# Patient Record
Sex: Female | Born: 1995 | Race: Black or African American | Hispanic: No | Marital: Married | Smoking: Never smoker
Health system: Southern US, Community
[De-identification: ages and names within clinical notes are randomized; demographics above are authoritative.]

## PROBLEM LIST (undated history)

## (undated) ENCOUNTER — Inpatient Hospital Stay (HOSPITAL_COMMUNITY): Payer: Self-pay

## (undated) DIAGNOSIS — Z789 Other specified health status: Secondary | ICD-10-CM

## (undated) HISTORY — PX: NO PAST SURGERIES: SHX2092

---

## 2018-09-25 ENCOUNTER — Inpatient Hospital Stay (HOSPITAL_COMMUNITY): Payer: Self-pay

## 2018-09-25 ENCOUNTER — Ambulatory Visit (HOSPITAL_COMMUNITY)
Admission: EM | Admit: 2018-09-25 | Discharge: 2018-09-25 | Disposition: A | Discharge status: Nursing Home (Includes ICF) | Payer: Self-pay | Attending: Family Medicine | Admitting: Family Medicine

## 2018-09-25 ENCOUNTER — Encounter (HOSPITAL_COMMUNITY): Payer: Self-pay

## 2018-09-25 ENCOUNTER — Inpatient Hospital Stay (HOSPITAL_COMMUNITY)
Admission: AD | Admit: 2018-09-25 | Discharge: 2018-09-26 | Disposition: A | Discharge status: Home / Self Care | Payer: Self-pay | Source: Ambulatory Visit | Attending: Obstetrics & Gynecology | Admitting: Obstetrics & Gynecology

## 2018-09-25 ENCOUNTER — Ambulatory Visit
Admission: EM | Admit: 2018-09-25 | Discharge: 2018-09-25 | Disposition: A | Discharge status: Home / Self Care | Payer: Self-pay | Attending: Physician Assistant | Admitting: Physician Assistant

## 2018-09-25 ENCOUNTER — Other Ambulatory Visit: Payer: Self-pay

## 2018-09-25 DIAGNOSIS — Z3201 Encounter for pregnancy test, result positive: Secondary | ICD-10-CM

## 2018-09-25 DIAGNOSIS — O26891 Other specified pregnancy related conditions, first trimester: Secondary | ICD-10-CM | POA: Insufficient documentation

## 2018-09-25 DIAGNOSIS — R102 Pelvic and perineal pain: Secondary | ICD-10-CM | POA: Insufficient documentation

## 2018-09-25 DIAGNOSIS — O26899 Other specified pregnancy related conditions, unspecified trimester: Secondary | ICD-10-CM

## 2018-09-25 DIAGNOSIS — Z3A01 Less than 8 weeks gestation of pregnancy: Secondary | ICD-10-CM | POA: Insufficient documentation

## 2018-09-25 DIAGNOSIS — O3680X0 Pregnancy with inconclusive fetal viability, not applicable or unspecified: Secondary | ICD-10-CM

## 2018-09-25 DIAGNOSIS — Z3401 Encounter for supervision of normal first pregnancy, first trimester: Secondary | ICD-10-CM

## 2018-09-25 DIAGNOSIS — R1030 Lower abdominal pain, unspecified: Secondary | ICD-10-CM

## 2018-09-25 LAB — WET PREP, GENITAL
Clue Cells Wet Prep HPF POC: NONE SEEN
Sperm: NONE SEEN
Trich, Wet Prep: NONE SEEN
Yeast Wet Prep HPF POC: NONE SEEN

## 2018-09-25 LAB — POCT URINALYSIS DIP (DEVICE)
Bilirubin Urine: NEGATIVE
Glucose, UA: NEGATIVE mg/dL
Hgb urine dipstick: NEGATIVE
Ketones, ur: NEGATIVE mg/dL
Leukocytes, UA: NEGATIVE
NITRITE: NEGATIVE
Protein, ur: NEGATIVE mg/dL
Specific Gravity, Urine: 1.02 (ref 1.005–1.030)
Urobilinogen, UA: 0.2 mg/dL (ref 0.0–1.0)
pH: 6 (ref 5.0–8.0)

## 2018-09-25 LAB — CBC
HCT: 40.9 % (ref 36.0–46.0)
Hemoglobin: 14.2 g/dL (ref 12.0–15.0)
MCH: 29.2 pg (ref 26.0–34.0)
MCHC: 34.7 g/dL (ref 30.0–36.0)
MCV: 84 fL (ref 80.0–100.0)
Platelets: 301 10*3/uL (ref 150–400)
RBC: 4.87 MIL/uL (ref 3.87–5.11)
RDW: 12.9 % (ref 11.5–15.5)
WBC: 8.2 10*3/uL (ref 4.0–10.5)
nRBC: 0 % (ref 0.0–0.2)

## 2018-09-25 LAB — HCG, QUANTITATIVE, PREGNANCY: hCG, Beta Chain, Quant, S: 2710 m[IU]/mL — ABNORMAL HIGH (ref <5)

## 2018-09-25 LAB — POCT URINE PREGNANCY: Preg Test, Ur: POSITIVE — AB

## 2018-09-25 LAB — POCT PREGNANCY, URINE: Preg Test, Ur: POSITIVE — AB

## 2018-09-25 MED ORDER — PRENATAL VITAMINS 0.8 MG PO TABS: 1.0000 | ORAL_TABLET | Freq: Every day | ORAL | 2 refills | Status: AC

## 2018-09-25 MED ORDER — ACETAMINOPHEN 325 MG PO TABS: 650.0000 mg | ORAL_TABLET | Freq: Four times a day (QID) | ORAL | 0 refills | Status: AC | PRN

## 2018-09-25 NOTE — MAU Provider Note (Signed)
Chief Complaint: Abdominal Pain   Provider saw patient at 2329     SUBJECTIVE HPI: Jenna Miranda is a 23 y.o. G1P0 at 7538w1d by LMP who presents to maternity admissions reporting lower abdominal pain since this afternoon.  States it is over her bladder.  . She denies vaginal bleeding, vaginal itching/burning, h/a, dizziness, n/v, or fever/chills.    Abdominal Pain  This is a new problem. The current episode started today. The problem occurs intermittently. The problem has been unchanged. The quality of the pain is cramping. The abdominal pain does not radiate. Pertinent negatives include no constipation, diarrhea, dysuria, myalgias, nausea or vomiting. Nothing aggravates the pain. The pain is relieved by nothing. She has tried nothing for the symptoms.   RN Note: Pt husband translating on phone. Pt husband reports that he translated for the pt at Urgent Care. Pt here for abdominal pain that started today around 2pm. Pt describes the pain as bladder pain and its moving under her stomach. Pt denies urinary burning or pain. Denies vaginal bleeding or vaginal discharge. Rates pain 8/10. Pt has not tried anything for pain. LMP 08/21/2018.  No past medical history on file. No past surgical history on file. Social History   Socioeconomic History  . Marital status: Married    Spouse name: Not on file  . Number of children: Not on file  . Years of education: Not on file  . Highest education level: Not on file  Occupational History  . Not on file  Social Needs  . Financial resource strain: Not on file  . Food insecurity:    Worry: Not on file    Inability: Not on file  . Transportation needs:    Medical: Not on file    Non-medical: Not on file  Tobacco Use  . Smoking status: Never Smoker  . Smokeless tobacco: Never Used  Substance and Sexual Activity  . Alcohol use: Not Currently  . Drug use: Not Currently  . Sexual activity: Not on file  Lifestyle  . Physical activity:    Days  per week: Not on file    Minutes per session: Not on file  . Stress: Not on file  Relationships  . Social connections:    Talks on phone: Not on file    Gets together: Not on file    Attends religious service: Not on file    Active member of club or organization: Not on file    Attends meetings of clubs or organizations: Not on file    Relationship status: Not on file  . Intimate partner violence:    Fear of current or ex partner: Not on file    Emotionally abused: Not on file    Physically abused: Not on file    Forced sexual activity: Not on file  Other Topics Concern  . Not on file  Social History Narrative  . Not on file   No current facility-administered medications on file prior to encounter.    Current Outpatient Medications on File Prior to Encounter  Medication Sig Dispense Refill  . Prenatal Multivit-Min-Fe-FA (PRENATAL VITAMINS) 0.8 MG tablet Take 1 tablet by mouth daily. 90 tablet 2   No Known Allergies  I have reviewed patient's Past Medical Hx, Surgical Hx, Family Hx, Social Hx, medications and allergies.   ROS:  Review of Systems  Gastrointestinal: Positive for abdominal pain. Negative for constipation, diarrhea, nausea and vomiting.  Genitourinary: Negative for dysuria.  Musculoskeletal: Negative for myalgias.   Review  of Systems  Other systems negative   Physical Exam  Physical Exam Patient Vitals for the past 24 hrs:  BP Temp Temp src Pulse Resp SpO2 Weight  09/25/18 2315 121/70 98.4 F (36.9 C) Oral 74 16 98 % 64.4 kg   Constitutional: Well-developed, well-nourished female in no acute distress.  Cardiovascular: normal rate Respiratory: normal effort GI: Abd soft, non-tender. Pos BS x 4 MS: Extremities nontender, no edema, normal ROM Neurologic: Alert and oriented x 4.  GU: Neg CVAT.  PELVIC EXAM: Cervix pink, visually closed, without lesion, scant white creamy discharge, vaginal walls and external genitalia normal Bimanual exam: Cervix  0/long/high, firm, anterior, neg CMT, uterus slightly tender, nonenlarged, adnexa without tenderness, enlargement, or mass  No focal adnexal tenderness  LAB RESULTS Results for orders placed or performed during the hospital encounter of 09/25/18 (from the past 24 hour(s))  CBC     Status: None   Collection Time: 09/25/18  8:49 PM  Result Value Ref Range   WBC 8.2 4.0 - 10.5 K/uL   RBC 4.87 3.87 - 5.11 MIL/uL   Hemoglobin 14.2 12.0 - 15.0 g/dL   HCT 40.940.9 81.136.0 - 91.446.0 %   MCV 84.0 80.0 - 100.0 fL   MCH 29.2 26.0 - 34.0 pg   MCHC 34.7 30.0 - 36.0 g/dL   RDW 78.212.9 95.611.5 - 21.315.5 %   Platelets 301 150 - 400 K/uL   nRBC 0.0 0.0 - 0.2 %  hCG, quantitative, pregnancy     Status: Abnormal   Collection Time: 09/25/18  8:49 PM  Result Value Ref Range   hCG, Beta Chain, Quant, S 2,710 (H) <5 mIU/mL  ABO/Rh     Status: None (Preliminary result)   Collection Time: 09/25/18  8:49 PM  Result Value Ref Range   ABO/RH(D)      A POS Performed at [facility], [address] [place], [place] [address]     --/--/A POS Performed at [facility], [address] [place], [place] [address]  [phone](01/16 2049)  IMAGING Kore[place] Ob Comp Less 14 Wks  Result Date: 09/25/2018 CLINICAL DATA:  23 year old female with lower abdominal pain today. Quantitative beta HCG 2,710. Estimated gestational age by LMP 4 weeks and 6 days. EXAM: OBSTETRIC <14 WK US AND TRANSVAGINAL OB US TECHNIQUE: Both transabdominal and transvaginal ultrasound examinations were performed for complete evaluation of the gestation as well as the maternal uterus, adnexal regions, and pelvic cul-de-sac. Transvaginal technique was performed to assess early pregnancy. COMPARISON:  None. FINDINGS: Intrauterine gestational sac: Possible single (image 2839). Yolk sac:  Not identified. Embryo:  Not identified. Cardiac Activity: Not applicable MSD: 4.3 mm   5 w   1 d Subchorionic hemorrhage:  None visualized. Maternal uterus/adnexae: Probable left ovary  corpus luteum cyst (image 45). The left ovary measures 1.3 x 1.6 x 2.1 centimeters. The right ovary measures 3.5 x 1.9 by 1.8 centimeters. There is a trace or small volume of simple appearing pelvic free fluid. IMPRESSION: Probable early intrauterine gestational sac, but no yolk sac, fetal pole, or cardiac activity yet visualized. Recommend follow-up quantitative B-HCG levels and follow-up US in 14 days to confirm and assess viability. This recommendation follows SRU consensus guidelines: Diagnostic Criteria for Nonviable Pregnancy Early in the First Trimester. Malva Limes Engl J Med 2013; 841:3244-01; 369:1443-51. Electronically Signed   By: Odessa FlemingH  Hall M.D.   On: 09/25/2018 23:06   [place] Ob Transvaginal  Result Date: 09/25/2018 CLINICAL DATA:  23 year old female with lower abdominal pain today. Quantitative  beta HCG 2,710. Estimated gestational age by LMP 4 weeks and 6 days. EXAM: OBSTETRIC <14 WK [place] AND TRANSVAGINAL OB US TECHNIQUE: Both transabdominal and transvaginal ultrasound examinations were performed for complete evaluation of the gestation as well as the maternal uterus, adnexal regions, and pelvic cul-de-sac. Transvaginal technique was performed to assess early pregnancy. COMPARISON:  None. FINDINGS: Intrauterine gestational sac: Possible single (image 48). Yolk sac:  Not identified. Embryo:  Not identified. Cardiac Activity: Not applicable MSD: 4.3 mm   5 w   1 d Subchorionic hemorrhage:  None visualized. Maternal uterus/adnexae: Probable left ovary corpus luteum cyst (image 45). The left ovary measures 1.3 x 1.6 x 2.1 centimeters. The right ovary measures 3.5 x 1.9 by 1.8 centimeters. There is a trace or small volume of simple appearing pelvic free fluid. IMPRESSION: Probable early intrauterine gestational sac, but no yolk sac, fetal pole, or cardiac activity yet visualized. Recommend follow-up quantitative B-HCG levels and follow-up US in 14 days to confirm and assess viability. This recommendation follows SRU consensus  guidelines: Diagnostic Criteria for Nonviable Pregnancy Early in the First Trimester. Malva Limes Med 2013; 275:1700-17. Electronically Signed   By: Odessa Fleming M.D.   On: 09/25/2018 23:06    MAU Management/MDM: Ordered usual first trimester r/o ectopic labs.   Pelvic exam and cultures done Will check baseline Ultrasound to rule out ectopic..   This bleeding/pain can represent a normal pregnancy with bleeding, spontaneous abortion or even an ectopic which can be life-threatening.  The process as listed above helps to determine which of these is present.  Explained that we cannot know for sure where pregnancy is and how well it is developing.  We need to repeat HCG on Sunday and [place] in a week.   ASSESSMENT 1. Pelvic pain affecting pregnancy   2. Pregnancy of unknown anatomic location   3.     Probable early intrauterine gestational sac  PLAN Discharge home Plan to repeat HCG level in 48 hours in MAU Will repeat  Ultrasound in about 7-10 days if HCG levels double appropriately  Ectopic precautions  Follow-up Information    [facility] Follow up.   Specialty:  Obstetrics and Gynecology Why:  Come back Sunday morning Contact information: [address] [id number] mc [place] [address] [phone]         Pt stable at time of discharge. Encouraged to return here or to other Urgent Care/ED if she develops worsening of symptoms, increase in pain, fever, or other concerning symptoms.    Wynelle Bourgeois CNM, MSN Certified Nurse-Midwife 09/25/2018  11:47 PM

## 2018-09-25 NOTE — MAU Note (Signed)
Pt husband translating on phone. Pt husband reports that he translated for the pt at Urgent Care. Pt here for abdominal pain that started today around 2pm. Pt describes the pain as bladder pain and its moving under her stomach. Pt denies urinary burning or pain. Denies vaginal bleeding or vaginal discharge. Rates pain 8/10. Pt has not tried anything for pain. LMP 08/21/2018.

## 2018-09-25 NOTE — ED Triage Notes (Signed)
Pt presents to [facility] for lower abdominal pain since today,possible pregnancy.

## 2018-09-25 NOTE — ED Triage Notes (Signed)
Pt requesting pregnancy test and birth control, LMP 08/22/18; no other complaints

## 2018-09-25 NOTE — Discharge Instructions (Signed)
Abdominal Pain During Pregnancy ° °Abdominal pain is common during pregnancy, and has many possible causes. Some causes are more serious than others, and sometimes the cause is not known. Abdominal pain can be a sign that labor is starting. It can also be caused by normal growth and stretching of muscles and ligaments during pregnancy. Always tell your health care provider if you have any abdominal pain. °Follow these instructions at home: °· Do not have sex or put anything in your vagina until your pain goes away completely. °· Get plenty of rest until your pain improves. °· Drink enough fluid to keep your urine pale yellow. °· Take over-the-counter and prescription medicines only as told by your health care provider. °· Keep all follow-up visits as told by your health care provider. This is important. °Contact a health care provider if: °· Your pain continues or gets worse after resting. °· You have lower abdominal pain that: °? Comes and goes at regular intervals. °? Spreads to your back. °? Is similar to menstrual cramps. °· You have pain or burning when you urinate. °Get help right away if: °· You have a fever or chills. °· You have vaginal bleeding. °· You are leaking fluid from your vagina. °· You are passing tissue from your vagina. °· You have vomiting or diarrhea that lasts for more than 24 hours. °· Your baby is moving less than usual. °· You feel very weak or faint. °· You have shortness of breath. °· You develop severe pain in your upper abdomen. °Summary °· Abdominal pain is common during pregnancy, and has many possible causes. °· If you experience abdominal pain during pregnancy, tell your health care provider right away. °· Follow your health care provider's home care instructions and keep all follow-up visits as directed. °This information is not intended to replace advice given to you by your health care provider. Make sure you discuss any questions you have with your health care  provider. °Document Released: 08/27/2005 Document Revised: 11/29/2016 Document Reviewed: 11/29/2016 °Elsevier Interactive Patient Education © 2019 Elsevier Inc. ° °

## 2018-09-25 NOTE — MAU Note (Addendum)
Pt reports abdominal pain but no bleeding. Unable to triage patient further. Patient is alone and speaks xhosa. Per [employer], there is no xhosa interpretor. Pt does not have family with her.

## 2018-09-25 NOTE — ED Provider Notes (Signed)
[facility]    CSN: [id number] Arrival date & time: 09/25/18  1039     History   Chief Complaint Chief Complaint  Patient presents with  . preg test and birth controll    HPI Jenna Miranda is a 23 y.o. female.   The history is provided by the patient. No language interpreter was used.  Possible Pregnancy  This is a new problem. The current episode started more than 1 week ago. The problem occurs constantly. The problem has not changed since onset.Pertinent negatives include no abdominal pain. Nothing aggravates the symptoms. Nothing relieves the symptoms. She has tried nothing for the symptoms.  Pt reports her period is late.  Pt request a pregnany test   History reviewed. No pertinent past medical history.  There are no active problems to display for this patient.   History reviewed. No pertinent surgical history.  OB History   No obstetric history on file.      Home Medications    Prior to Admission medications   Not on File    Family History No family history on file.  Social History Social History   Tobacco Use  . Smoking status: Never Smoker  . Smokeless tobacco: Never Used  Substance Use Topics  . Alcohol use: Not Currently  . Drug use: Not Currently     Allergies   Patient has no known allergies.   Review of Systems Review of Systems  Gastrointestinal: Negative for abdominal pain.  All other systems reviewed and are negative.    Physical Exam Triage Vital Signs ED Triage Vitals  Enc Vitals Group     BP 09/25/18 1043 108/75     Pulse --      Resp 09/25/18 1043 16     Temp 09/25/18 1043 98.1 F (36.7 C)     Temp Source 09/25/18 1043 Oral     SpO2 09/25/18 1043 98 %     Weight --      Height --      Head Circumference --      Peak Flow --      Pain Score 09/25/18 1044 0     Pain Loc --      Pain Edu? --      Excl. in GC? --    No data found.  Updated Vital Signs BP 108/75 (BP Location: Left Arm)    Temp 98.1 F (36.7 C) (Oral)   Resp 16   LMP 08/22/2018   SpO2 98%   Visual Acuity Right Eye Distance:   Left Eye Distance:   Bilateral Distance:    Right Eye Near:   Left Eye Near:    Bilateral Near:     Physical Exam Vitals signs and nursing note reviewed.  Constitutional:      Appearance: She is well-developed.  HENT:     Head: Normocephalic.  Neck:     Musculoskeletal: Normal range of motion.  Pulmonary:     Effort: Pulmonary effort is normal.  Abdominal:     General: There is no distension.  Musculoskeletal: Normal range of motion.  Neurological:     Mental Status: She is alert and oriented to person, place, and time.      UC Treatments / Results  Labs (all labs ordered are listed, but only abnormal results are displayed) Labs Reviewed  POCT URINE PREGNANCY - Abnormal; Notable for the following components:      Result Value   Preg Test, Ur Positive (*)  All other components within normal limits    EKG None  Radiology No results found.  Procedures Procedures (including critical care time)  Medications Ordered in UC Medications - No data to display  Initial Impression / Assessment and Plan / UC Course  I have reviewed the triage vital signs and the nursing notes.  Pertinent labs & imaging results that were available during my care of the patient were reviewed by me and considered in my medical decision making (see chart for details).     UPT is positive  Final Clinical Impressions(s) / UC Diagnoses   Final diagnoses:  Encounter for supervision of normal first pregnancy in first trimester   Discharge Instructions   None    ED Prescriptions    Medication Sig Dispense Auth. Provider   Prenatal Multivit-Min-Fe-FA (PRENATAL VITAMINS) 0.8 MG tablet Take 1 tablet by mouth daily. 90 tablet Elson Areas, [place]    An After Visit Summary was printed and given to the patient.  Controlled Substance Prescriptions [place] Controlled Substance  Registry consulted?    Elson Areas, [place] 09/25/18 1112

## 2018-09-26 DIAGNOSIS — O3680X0 Pregnancy with inconclusive fetal viability, not applicable or unspecified: Secondary | ICD-10-CM

## 2018-09-26 LAB — HIV ANTIBODY (ROUTINE TESTING W REFLEX): HIV Screen 4th Generation wRfx: NONREACTIVE

## 2018-09-26 LAB — ABO/RH: ABO/RH(D): A POS

## 2018-09-29 LAB — GC/CHLAMYDIA PROBE AMP (~~LOC~~) NOT AT ARMC
CHLAMYDIA, DNA PROBE: NEGATIVE
Neisseria Gonorrhea: NEGATIVE

## 2018-10-04 ENCOUNTER — Inpatient Hospital Stay (HOSPITAL_COMMUNITY): Payer: Self-pay

## 2018-10-04 ENCOUNTER — Encounter (HOSPITAL_COMMUNITY): Payer: Self-pay | Admitting: *Deleted

## 2018-10-04 ENCOUNTER — Inpatient Hospital Stay (HOSPITAL_COMMUNITY)
Admission: AD | Admit: 2018-10-04 | Discharge: 2018-10-04 | Disposition: A | Discharge status: Home / Self Care | Payer: Self-pay | Attending: Obstetrics & Gynecology | Admitting: Obstetrics & Gynecology

## 2018-10-04 DIAGNOSIS — O468X1 Other antepartum hemorrhage, first trimester: Secondary | ICD-10-CM

## 2018-10-04 DIAGNOSIS — O99611 Diseases of the digestive system complicating pregnancy, first trimester: Secondary | ICD-10-CM | POA: Insufficient documentation

## 2018-10-04 DIAGNOSIS — O26891 Other specified pregnancy related conditions, first trimester: Secondary | ICD-10-CM

## 2018-10-04 DIAGNOSIS — O418X10 Other specified disorders of amniotic fluid and membranes, first trimester, not applicable or unspecified: Secondary | ICD-10-CM

## 2018-10-04 DIAGNOSIS — K219 Gastro-esophageal reflux disease without esophagitis: Secondary | ICD-10-CM | POA: Insufficient documentation

## 2018-10-04 DIAGNOSIS — R109 Unspecified abdominal pain: Secondary | ICD-10-CM

## 2018-10-04 DIAGNOSIS — Z3A01 Less than 8 weeks gestation of pregnancy: Secondary | ICD-10-CM | POA: Insufficient documentation

## 2018-10-04 DIAGNOSIS — O26899 Other specified pregnancy related conditions, unspecified trimester: Secondary | ICD-10-CM

## 2018-10-04 DIAGNOSIS — O208 Other hemorrhage in early pregnancy: Secondary | ICD-10-CM | POA: Insufficient documentation

## 2018-10-04 LAB — CBC WITH DIFFERENTIAL/PLATELET
Basophils Absolute: 0 10*3/uL (ref 0.0–0.1)
Basophils Relative: 0 %
Eosinophils Absolute: 0.1 10*3/uL (ref 0.0–0.5)
Eosinophils Relative: 2 %
HEMATOCRIT: 39.6 % (ref 36.0–46.0)
Hemoglobin: 13.8 g/dL (ref 12.0–15.0)
Lymphocytes Relative: 55 %
Lymphs Abs: 3.9 10*3/uL (ref 0.7–4.0)
MCH: 29.9 pg (ref 26.0–34.0)
MCHC: 34.8 g/dL (ref 30.0–36.0)
MCV: 85.7 fL (ref 80.0–100.0)
Monocytes Absolute: 0.4 10*3/uL (ref 0.1–1.0)
Monocytes Relative: 6 %
NEUTROS PCT: 37 %
NRBC: 0 % (ref 0.0–0.2)
Neutro Abs: 2.7 10*3/uL (ref 1.7–7.7)
Platelets: 261 10*3/uL (ref 150–400)
RBC: 4.62 MIL/uL (ref 3.87–5.11)
RDW: 13.1 % (ref 11.5–15.5)
WBC: 7.1 10*3/uL (ref 4.0–10.5)

## 2018-10-04 LAB — URINALYSIS, ROUTINE W REFLEX MICROSCOPIC
Bilirubin Urine: NEGATIVE
Glucose, UA: NEGATIVE mg/dL
Hgb urine dipstick: NEGATIVE
KETONES UR: NEGATIVE mg/dL
Leukocytes, UA: NEGATIVE
NITRITE: NEGATIVE
PH: 5 (ref 5.0–8.0)
Protein, ur: NEGATIVE mg/dL
Specific Gravity, Urine: 1.013 (ref 1.005–1.030)

## 2018-10-04 MED ORDER — LIDOCAINE VISCOUS HCL 2 % MT SOLN
15.0000 mL | Freq: Once | OROMUCOSAL | Status: AC
  Administered 2018-10-04: 15 mL via ORAL
  Filled 2018-10-04: qty 15

## 2018-10-04 MED ORDER — FAMOTIDINE 20 MG PO TABS: 20.0000 mg | ORAL_TABLET | Freq: Two times a day (BID) | ORAL | 1 refills | Status: AC

## 2018-10-04 MED ORDER — ALUM & MAG HYDROXIDE-SIMETH 200-200-20 MG/5ML PO SUSP
30.0000 mL | Freq: Once | ORAL | Status: AC
  Administered 2018-10-04: 30 mL via ORAL
  Filled 2018-10-04: qty 30

## 2018-10-04 NOTE — MAU Provider Note (Signed)
History     CSN: [id number]  Arrival date and time: 10/04/18 1658   First Provider Initiated Contact with Patient 10/04/18 1800      Chief Complaint  Patient presents with  . Abdominal Pain  . Chest Pain   HPI [name] is a 23 y.o. G2P1001 at [redacted]w[redacted]d who presents to MAU today with complaint of lower abdominal pain x 5 days rated at 8/10 now. She has been taking Tylenol. She denies vaginal bleeding. She was seen in MAU on 1/16 and US showed PUL. She was supposed to follow-up in 2 days and did not. She denies bleeding. She has also had midline chest pain without SOB.    OB History    Gravida  2   Para  1   Term  1   Preterm      AB      Living  1     SAB      TAB      Ectopic      Multiple      Live Births  1           History reviewed. No pertinent past medical history.  History reviewed. No pertinent surgical history.  History reviewed. No pertinent family history.  Social History   Tobacco Use  . Smoking status: Never Smoker  . Smokeless tobacco: Never Used  Substance Use Topics  . Alcohol use: Not Currently  . Drug use: Not Currently    Allergies: No Known Allergies  No medications prior to admission.    Review of Systems  Respiratory: Negative for shortness of breath.   Cardiovascular: Positive for chest pain.  Gastrointestinal: Positive for abdominal pain.  Genitourinary: Negative for vaginal bleeding and vaginal discharge.   Physical Exam   Blood pressure 120/61, pulse 72, temperature 98.5 F (36.9 C), temperature source Oral, resp. rate 16, weight 64.9 kg, last menstrual period 08/22/2018, SpO2 100 %.  Physical Exam  Nursing note and vitals reviewed. Constitutional: She is oriented to person, place, and time. She appears well-developed and well-nourished. No distress.  HENT:  Head: Normocephalic and atraumatic.  Cardiovascular: Normal rate.  Respiratory: Effort normal.  GI: Soft. She exhibits no distension and no  mass. There is abdominal tenderness (mild diffuse). There is no rebound and no guarding.  Neurological: She is alert and oriented to person, place, and time.  Skin: Skin is warm and dry. No erythema.  Psychiatric: She has a normal mood and affect.  Cervix: closed, thick   Results for orders placed or performed during the hospital encounter of 10/04/18 (from the past 24 hour(s))  Urinalysis, Routine w reflex microscopic     Status: None   Collection Time: 10/04/18  5:42 PM  Result Value Ref Range   Color, Urine YELLOW YELLOW   APPearance CLEAR CLEAR   Specific Gravity, Urine 1.013 1.005 - 1.030   pH 5.0 5.0 - 8.0   Glucose, UA NEGATIVE NEGATIVE mg/dL   Hgb urine dipstick NEGATIVE NEGATIVE   Bilirubin Urine NEGATIVE NEGATIVE   Ketones, ur NEGATIVE NEGATIVE mg/dL   Protein, ur NEGATIVE NEGATIVE mg/dL   Nitrite NEGATIVE NEGATIVE   Leukocytes, UA NEGATIVE NEGATIVE  CBC with Differential/Platelet     Status: None   Collection Time: 10/04/18  5:53 PM  Result Value Ref Range   WBC 7.1 4.0 - 10.5 K/uL   RBC 4.62 3.87 - 5.11 MIL/uL   Hemoglobin 13.8 12.0 - 15.0 g/dL   HCT 91.7 91.5 -  46.0 %   MCV 85.7 80.0 - 100.0 fL   MCH 29.9 26.0 - 34.0 pg   MCHC 34.8 30.0 - 36.0 g/dL   RDW 27.2 53.6 - 64.4 %   Platelets 261 150 - 400 K/uL   nRBC 0.0 0.0 - 0.2 %   Neutrophils Relative % 37 %   Neutro Abs 2.7 1.7 - 7.7 K/uL   Lymphocytes Relative 55 %   Lymphs Abs 3.9 0.7 - 4.0 K/uL   Monocytes Relative 6 %   Monocytes Absolute 0.4 0.1 - 1.0 K/uL   Eosinophils Relative 2 %   Eosinophils Absolute 0.1 0.0 - 0.5 K/uL   Basophils Relative 0 %   Basophils Absolute 0.0 0.0 - 0.1 K/uL   US Ob Comp Less 14 Wks  Result Date: 09/25/2018 CLINICAL DATA:  23 year old female with lower abdominal pain today. Quantitative beta HCG 2,710. Estimated gestational age by LMP 4 weeks and 6 days. EXAM: OBSTETRIC <14 WK [place] AND TRANSVAGINAL OB US TECHNIQUE: Both transabdominal and transvaginal ultrasound examinations  were performed for complete evaluation of the gestation as well as the maternal uterus, adnexal regions, and pelvic cul-de-sac. Transvaginal technique was performed to assess early pregnancy. COMPARISON:  None. FINDINGS: Intrauterine gestational sac: Possible single (image 28). Yolk sac:  Not identified. Embryo:  Not identified. Cardiac Activity: Not applicable MSD: 4.3 mm   5 w   1 d Subchorionic hemorrhage:  None visualized. Maternal uterus/adnexae: Probable left ovary corpus luteum cyst (image 45). The left ovary measures 1.3 x 1.6 x 2.1 centimeters. The right ovary measures 3.5 x 1.9 by 1.8 centimeters. There is a trace or small volume of simple appearing pelvic free fluid. IMPRESSION: Probable early intrauterine gestational sac, but no yolk sac, fetal pole, or cardiac activity yet visualized. Recommend follow-up quantitative B-HCG levels and follow-up US in 14 days to confirm and assess viability. This recommendation follows SRU consensus guidelines: Diagnostic Criteria for Nonviable Pregnancy Early in the First Trimester. Malva Limes Med 2013; 034:7425-95. Electronically Signed   By: Odessa Fleming M.D.   On: 09/25/2018 23:06   US Ob Transvaginal  Result Date: 10/04/2018 CLINICAL DATA:  Abdominal pain for 2 days. First trimester pregnancy with inconclusive fetal viability. EXAM: TRANSVAGINAL OB ULTRASOUND TECHNIQUE: Transvaginal ultrasound was performed for complete evaluation of the gestation as well as the maternal uterus, adnexal regions, and pelvic cul-de-sac. COMPARISON:  09/25/2018 FINDINGS: Intrauterine gestational sac: Single Yolk sac:  Visualized. Embryo:  Visualized. Cardiac Activity: Visualized. Heart Rate: 105 bpm CRL:   2 mm   5 w 5 d                  [place] EDC: 06/01/2019 Subchorionic hemorrhage:  Small subchorionic hemorrhage noted. Maternal uterus/adnexae: Both ovaries are normal in appearance. No mass or abnormal free fluid identified. IMPRESSION: Single living IUP measuring 5 weeks 5 days, with [place] EDC  of 06/01/2019. Small subchorionic hemorrhage noted. Electronically Signed   By: Myles Rosenthal M.D.   On: 10/04/2018 19:17   US Ob Transvaginal  Result Date: 09/25/2018 CLINICAL DATA:  23 year old female with lower abdominal pain today. Quantitative beta HCG 2,710. Estimated gestational age by LMP 4 weeks and 6 days. EXAM: OBSTETRIC <14 WK [place] AND TRANSVAGINAL OB US TECHNIQUE: Both transabdominal and transvaginal ultrasound examinations were performed for complete evaluation of the gestation as well as the maternal uterus, adnexal regions, and pelvic cul-de-sac. Transvaginal technique was performed to assess early pregnancy. COMPARISON:  None. FINDINGS: Intrauterine gestational sac: Possible  single (image 39). Yolk sac:  Not identified. Embryo:  Not identified. Cardiac Activity: Not applicable MSD: 4.3 mm   5 w   1 d Subchorionic hemorrhage:  None visualized. Maternal uterus/adnexae: Probable left ovary corpus luteum cyst (image 45). The left ovary measures 1.3 x 1.6 x 2.1 centimeters. The right ovary measures 3.5 x 1.9 by 1.8 centimeters. There is a trace or small volume of simple appearing pelvic free fluid. IMPRESSION: Probable early intrauterine gestational sac, but no yolk sac, fetal pole, or cardiac activity yet visualized. Recommend follow-up quantitative B-HCG levels and follow-up US in 14 days to confirm and assess viability. This recommendation follows SRU consensus guidelines: Diagnostic Criteria for Nonviable Pregnancy Early in the First Trimester. Malva Limes Engl J Med 2013; 161:0960-45; 369:1443-51. Electronically Signed   By: Odessa FlemingH  Hall M.D.   On: 09/25/2018 23:06     MAU Course  Procedures None  MDM UA, CBC, EKG and US today  EKG - NSR GI cocktail given - patient reports pain is 4/10 at discharge Assessment and Plan  A: SIUP at 6963w5d Small subchorionic hemorrhage Abdominal pain in pregnancy, first trimester GERD   P:  Discharge home Rx for Pepcid sent to patient's pharmacy  First trimester precautions  discussed Patient advised to follow-up with [facility] to start prenatal care Patient may return to MAU as needed or if her condition were to change or worsen  Vonzella NippleJulie Wenzel, PA-C 10/04/2018, 8:33 PM

## 2018-10-04 NOTE — Discharge Instructions (Signed)
Subchorionic Hematoma ° °A subchorionic hematoma is a gathering of blood between the outer wall of the embryo (chorion) and the inner wall of the womb (uterus). °This condition can cause vaginal bleeding. If they cause little or no vaginal bleeding, early small hematomas usually shrink on their own and do not affect your baby or pregnancy. When bleeding starts later in pregnancy, or if the hematoma is larger or occurs in older pregnant women, the condition may be more serious. Larger hematomas may get bigger, which increases the chances of miscarriage. This condition also increases the risk of: °· Premature separation of the placenta from the uterus. °· Premature (preterm) labor. °· Stillbirth. °What are the causes? °The exact cause of this condition is not known. It occurs when blood is trapped between the placenta and the uterine wall because the placenta has separated from the original site of implantation. °What increases the risk? °You are more likely to develop this condition if: °· You were treated with fertility medicines. °· You conceived through in vitro fertilization (IVF). °What are the signs or symptoms? °Symptoms of this condition include: °· Vaginal spotting or bleeding. °· Contractions of the uterus. These cause abdominal pain. °Sometimes you may have no symptoms and the bleeding may only be seen when ultrasound images are taken (transvaginal ultrasound). °How is this diagnosed? °This condition is diagnosed based on a physical exam. This includes a pelvic exam. You may also have other tests, including: °· Blood tests. °· Urine tests. °· Ultrasound of the abdomen. °How is this treated? °Treatment for this condition can vary. Treatment may include: °· Watchful waiting. You will be monitored closely for any changes in bleeding. During this stage: °? The hematoma may be reabsorbed by the body. °? The hematoma may separate the fluid-filled space containing the embryo (gestational sac) from the wall of the  womb (endometrium). °· Medicines. °· Activity restriction. This may be needed until the bleeding stops. °Follow these instructions at home: °· Stay on bed rest if told to do so by your health care provider. °· Do not lift anything that is heavier than 10 lbs. (4.5 kg) or as told by your health care provider. °· Do not use any products that contain nicotine or tobacco, such as cigarettes and e-cigarettes. If you need help quitting, ask your health care provider. °· Track and write down the number of pads you use each day and how soaked (saturated) they are. °· Do not use tampons. °· Keep all follow-up visits as told by your health care provider. This is important. Your health care provider may ask you to have follow-up blood tests or ultrasound tests or both. °Contact a health care provider if: °· You have any vaginal bleeding. °· You have a fever. °Get help right away if: °· You have severe cramps in your stomach, back, abdomen, or pelvis. °· You pass large clots or tissue. Save any tissue for your health care provider to look at. °· You have more vaginal bleeding, and you faint or become lightheaded or weak. °Summary °· A subchorionic hematoma is a gathering of blood between the outer wall of the placenta and the uterus. °· This condition can cause vaginal bleeding. °· Sometimes you may have no symptoms and the bleeding may only be seen when ultrasound images are taken. °· Treatment may include watchful waiting, medicines, or activity restriction. °This information is not intended to replace advice given to you by your health care provider. Make sure you discuss any questions you   have with your health care provider. °Document Released: 12/12/2006 Document Revised: 10/23/2016 Document Reviewed: 10/23/2016 °Elsevier Interactive Patient Education © 2019 Elsevier Inc. ° °

## 2018-10-04 NOTE — MAU Note (Addendum)
Jenna Miranda is a 23 y.o. at [redacted]w[redacted]d here in MAU reporting:  +chest pain +lower abdominal pain Onset of complaint: x 2days Pain score: 8/10 Vitals:   10/04/18 1729  BP: 127/72  Pulse: 82  Resp: 16  Temp: 98.5 F (36.9 C)  SpO2: 100%    Lab orders placed from triage: ua RN trying to establish interpretor with [employer] phone service. Unable to be connected to [employer] dilect. Patient desires husband Elta [place]) to interpret who is on the phone.

## 2018-10-24 ENCOUNTER — Inpatient Hospital Stay (HOSPITAL_COMMUNITY)
Admission: AD | Admit: 2018-10-24 | Discharge: 2018-10-24 | Disposition: A | Discharge status: Home / Self Care | Payer: Medicaid Other | Attending: Obstetrics & Gynecology | Admitting: Obstetrics & Gynecology

## 2018-10-24 ENCOUNTER — Encounter (HOSPITAL_COMMUNITY): Payer: Self-pay | Admitting: *Deleted

## 2018-10-24 DIAGNOSIS — R1032 Left lower quadrant pain: Secondary | ICD-10-CM | POA: Diagnosis not present

## 2018-10-24 DIAGNOSIS — Z3A09 9 weeks gestation of pregnancy: Secondary | ICD-10-CM | POA: Insufficient documentation

## 2018-10-24 DIAGNOSIS — O26891 Other specified pregnancy related conditions, first trimester: Secondary | ICD-10-CM | POA: Diagnosis not present

## 2018-10-24 DIAGNOSIS — R109 Unspecified abdominal pain: Secondary | ICD-10-CM | POA: Diagnosis present

## 2018-10-24 HISTORY — DX: Other specified health status: Z78.9

## 2018-10-24 LAB — URINALYSIS, ROUTINE W REFLEX MICROSCOPIC
Bilirubin Urine: NEGATIVE
GLUCOSE, UA: NEGATIVE mg/dL
Hgb urine dipstick: NEGATIVE
Ketones, ur: NEGATIVE mg/dL
Leukocytes,Ua: NEGATIVE
Nitrite: NEGATIVE
PROTEIN: NEGATIVE mg/dL
Specific Gravity, Urine: 1.01 (ref 1.005–1.030)
pH: 7.5 (ref 5.0–8.0)

## 2018-10-24 LAB — WET PREP, GENITAL
Clue Cells Wet Prep HPF POC: NONE SEEN
Sperm: NONE SEEN
Trich, Wet Prep: NONE SEEN
Yeast Wet Prep HPF POC: NONE SEEN

## 2018-10-24 MED ORDER — ACETAMINOPHEN 500 MG PO TABS
1000.0000 mg | ORAL_TABLET | Freq: Once | ORAL | Status: AC
  Administered 2018-10-24: 1000 mg via ORAL
  Filled 2018-10-24: qty 2

## 2018-10-24 NOTE — Discharge Instructions (Signed)
Abdominal Pain During Pregnancy ° °Abdominal pain is common during pregnancy, and has many possible causes. Some causes are more serious than others, and sometimes the cause is not known. Abdominal pain can be a sign that labor is starting. It can also be caused by normal growth and stretching of muscles and ligaments during pregnancy. Always tell your health care provider if you have any abdominal pain. °Follow these instructions at home: °· Do not have sex or put anything in your vagina until your pain goes away completely. °· Get plenty of rest until your pain improves. °· Drink enough fluid to keep your urine pale yellow. °· Take over-the-counter and prescription medicines only as told by your health care provider. °· Keep all follow-up visits as told by your health care provider. This is important. °Contact a health care provider if: °· Your pain continues or gets worse after resting. °· You have lower abdominal pain that: °? Comes and goes at regular intervals. °? Spreads to your back. °? Is similar to menstrual cramps. °· You have pain or burning when you urinate. °Get help right away if: °· You have a fever or chills. °· You have vaginal bleeding. °· You are leaking fluid from your vagina. °· You are passing tissue from your vagina. °· You have vomiting or diarrhea that lasts for more than 24 hours. °· Your baby is moving less than usual. °· You feel very weak or faint. °· You have shortness of breath. °· You develop severe pain in your upper abdomen. °Summary °· Abdominal pain is common during pregnancy, and has many possible causes. °· If you experience abdominal pain during pregnancy, tell your health care provider right away. °· Follow your health care provider's home care instructions and keep all follow-up visits as directed. °This information is not intended to replace advice given to you by your health care provider. Make sure you discuss any questions you have with your health care  provider. °Document Released: 08/27/2005 Document Revised: 11/29/2016 Document Reviewed: 11/29/2016 °Elsevier Interactive Patient Education © 2019 Elsevier Inc. ° °

## 2018-10-24 NOTE — Progress Notes (Signed)
Pt will be evaluated by AP @ noon once interpreter available.

## 2018-10-24 NOTE — MAU Note (Signed)
Pt C/O vomiting, hasn't been able to hold anything down since yesterday morning.  Having LLQ pain x 2 days.  Had spotting this morning.

## 2018-10-24 NOTE — MAU Provider Note (Signed)
History     CSN: [id number]  Arrival date and time: 10/24/18 1038   First Provider Initiated Contact with Patient 10/24/18 1258      Chief Complaint  Patient presents with  . Stomach Ache   Jenna Miranda is a 23 y.o. G3P1002 at [redacted]w[redacted]d who presents for Stomach Ache. She states, through [employer], that the pain started after sex 2 days ago.  She denies vaginal discharge, bleeding, itching, burning, irritation, or odor.  She states that the pain is located in the left lower quadrant and is intermittent.  She states she had some bleeding this morning that she noted with wiping, but has had none since.  Patient also reports a bowel movement, this morning without issues.  She states she has been taking pills (and shows provider 2 different bottles of prenatal vitamins) daily for pain and prenatal.       OB History    Gravida  3   Para  2   Term  1   Preterm      AB      Living  2     SAB      TAB      Ectopic      Multiple      Live Births  1           Past Medical History:  Diagnosis Date  . Medical history non-contributory     Past Surgical History:  Procedure Laterality Date  . NO PAST SURGERIES      No family history on file.  Social History   Tobacco Use  . Smoking status: Never Smoker  . Smokeless tobacco: Never Used  Substance Use Topics  . Alcohol use: Not Currently  . Drug use: Not Currently    Allergies: No Known Allergies  Medications Prior to Admission  Medication Sig Dispense Refill Last Dose  . acetaminophen (TYLENOL) 325 MG tablet Take 2 tablets (650 mg total) by mouth every 6 (six) hours as needed. 30 tablet 0   . famotidine (PEPCID) 20 MG tablet Take 1 tablet (20 mg total) by mouth 2 (two) times daily. 60 tablet 1   . Prenatal Multivit-Min-Fe-FA (PRENATAL VITAMINS) 0.8 MG tablet Take 1 tablet by mouth daily. 90 tablet 2 Unknown at Unknown time    Review of Systems  Constitutional: Negative for chills and fever.   Gastrointestinal: Positive for abdominal pain. Negative for constipation, diarrhea, nausea and vomiting.  Genitourinary: Positive for vaginal bleeding. Negative for vaginal discharge.  Neurological: Negative for dizziness, light-headedness and headaches.   Physical Exam   Blood pressure (!) 111/56, pulse 83, temperature 98.1 F (36.7 C), temperature source Oral, resp. rate 16, weight 59.9 kg, last menstrual period 08/22/2018.  Physical Exam  Constitutional: She is oriented to person, place, and time. She appears well-developed and well-nourished.  HENT:  Head: Normocephalic and atraumatic.  Eyes: Conjunctivae are normal.  Neck: Normal range of motion.  Cardiovascular: Normal rate, regular rhythm and normal heart sounds.  Respiratory: Effort normal and breath sounds normal.  GI: Soft. Bowel sounds are normal. There is abdominal tenderness.  Genitourinary:    Vagina normal.  Cervix exhibits no motion tenderness, no discharge and no friability.    No vaginal discharge or bleeding.  No bleeding in the vagina.    Genitourinary Comments: Speculum Exam: -Vaginal Vault: Pink mucosa.  Small amt yellowish mucoid discharge in vault -wet prep collected -Cervix:Pink, no lesions, cysts, or polyps.  Appears closed. No active bleeding from  os-GC/CT collected -Bimanual Exam:  -Uterus:    Musculoskeletal: Normal range of motion.        General: No edema.  Neurological: She is alert and oriented to person, place, and time.  Skin: Skin is warm and dry.  Psychiatric: She has a normal mood and affect. Her behavior is normal.    MAU Course  Procedures Results for orders placed or performed during the hospital encounter of 10/24/18 (from the past 24 hour(s))  Urinalysis, Routine w reflex microscopic     Status: None   Collection Time: 10/24/18 10:38 AM  Result Value Ref Range   Color, Urine YELLOW YELLOW   APPearance CLEAR CLEAR   Specific Gravity, Urine 1.010 1.005 - 1.030   pH 7.5 5.0 - 8.0    Glucose, UA NEGATIVE NEGATIVE mg/dL   Hgb urine dipstick NEGATIVE NEGATIVE   Bilirubin Urine NEGATIVE NEGATIVE   Ketones, ur NEGATIVE NEGATIVE mg/dL   Protein, ur NEGATIVE NEGATIVE mg/dL   Nitrite NEGATIVE NEGATIVE   Leukocytes,Ua NEGATIVE NEGATIVE  Wet prep, genital     Status: Abnormal   Collection Time: 10/24/18 12:56 PM  Result Value Ref Range   Yeast Wet Prep HPF POC NONE SEEN NONE SEEN   Trich, Wet Prep NONE SEEN NONE SEEN   Clue Cells Wet Prep HPF POC NONE SEEN NONE SEEN   WBC, Wet Prep HPF POC FEW (A) NONE SEEN   Sperm NONE SEEN     MDM Pelvic Exam with cultures; Wet prep and GC/CT Labs: UA,  Tylenol  Assessment and Plan  23 year old G3P1002 at 9.2 weeks Lower Left Quadrant Pain  -Exam findings discussed. -Wet Prep Pending -GC/CT collected and sent -Tylenol XR -Discussed discontinuation of PNV twice daily.  Educated on tylenol usage for acute intermittent pain. -Will reassess after tylenol dosing.  Follow Up (1345) Tylenol given  Follow Up (1535) -Patient reports improvement in pain and requests discharge. -Bleeding Precautions given. -Encouraged to call or return to MAU if symptoms worsen or with the onset of new symptoms. -Discharged to home in improved condition.  Cherre Robins MSN, CNM 10/24/2018, 12:58 PM

## 2018-10-24 NOTE — MAU Note (Signed)
Pt speaks houssa, attempted to call  [employer] houssa interpreter, none available.  Appointment made for [employer]sa interpreter at 12:00.  Triage information obtained from pt's husband over the phone.

## 2018-10-27 LAB — GC/CHLAMYDIA PROBE AMP (~~LOC~~) NOT AT ARMC
CHLAMYDIA, DNA PROBE: NEGATIVE
NEISSERIA GONORRHEA: NEGATIVE

## 2019-02-03 ENCOUNTER — Ambulatory Visit
Admission: EM | Admit: 2019-02-03 | Discharge: 2019-02-03 | Disposition: A | Discharge status: Home / Self Care | Payer: Medicaid Other

## 2019-02-03 ENCOUNTER — Other Ambulatory Visit: Payer: Self-pay

## 2019-02-03 ENCOUNTER — Encounter: Payer: Self-pay | Admitting: Emergency Medicine

## 2019-02-03 DIAGNOSIS — Z3009 Encounter for other general counseling and advice on contraception: Secondary | ICD-10-CM

## 2019-02-03 NOTE — ED Triage Notes (Signed)
Pt presents to [facility] to get started on birth control

## 2019-02-03 NOTE — ED Provider Notes (Signed)
[facility]    CSN: [id number] Arrival date & time: 02/03/19  1146     History   Chief Complaint Chief Complaint  Patient presents with  . Medication Refill    HPI Jenna Miranda is a 23 y.o. female.   23 year old female comes in requesting birth control. HPI obtained by patient through phone interpreter. Patient was recently pregnant, had ultrasound 10/04/2018 showing IUP. Patient states she had a miscarriage 3 months ago, but did not have an evaluation after. She had some abdominal pains prior to miscarriage that has resolved after. Denies nausea, vomiting. Denies vaginal discharge, itching. She is currently on her cycle, starting 02/01/2019. Sexually active with one female partner. She does not have PCP/GYN. Has never been on birth control.      Past Medical History:  Diagnosis Date  . Medical history non-contributory     There are no active problems to display for this patient.   Past Surgical History:  Procedure Laterality Date  . NO PAST SURGERIES      OB History    Gravida  3   Para  2   Term  1   Preterm      AB      Living  2     SAB      TAB      Ectopic      Multiple      Live Births  1            Home Medications    Prior to Admission medications   Medication Sig Start Date End Date Taking? Authorizing Provider  acetaminophen (TYLENOL) 325 MG tablet Take 2 tablets (650 mg total) by mouth every 6 (six) hours as needed. 09/25/18   Aviva SignsWilliams, Marie L, CNM  famotidine (PEPCID) 20 MG tablet Take 1 tablet (20 mg total) by mouth 2 (two) times daily. 10/04/18   Marny LowensteinWenzel, Julie N, PA-C  Prenatal Multivit-Min-Fe-FA (PRENATAL VITAMINS) 0.8 MG tablet Take 1 tablet by mouth daily. 09/25/18   Elson AreasSofia, Leslie K, PA-C    Family History History reviewed. No pertinent family history.  Social History Social History   Tobacco Use  . Smoking status: Never Smoker  . Smokeless tobacco: Never Used  Substance Use Topics  . Alcohol use:  Not Currently  . Drug use: Not Currently     Allergies   Patient has no known allergies.   Review of Systems Review of Systems  Reason unable to perform ROS: See HPI as above.     Physical Exam Triage Vital Signs ED Triage Vitals  Enc Vitals Group     BP 02/03/19 1200 129/76     Pulse Rate 02/03/19 1200 81     Resp 02/03/19 1200 18     Temp 02/03/19 1200 98.7 F (37.1 C)     Temp Source 02/03/19 1200 Oral     SpO2 02/03/19 1200 99 %     Weight --      Height --      Head Circumference --      Peak Flow --      Pain Score 02/03/19 1202 0     Pain Loc --      Pain Edu? --      Excl. in GC? --    No data found.  Updated Vital Signs BP 129/76 (BP Location: Right Arm)   Pulse 81   Temp 98.7 F (37.1 C) (Oral)   Resp 18   LMP  08/22/2018   SpO2 99%   Breastfeeding Unknown   Physical Exam Constitutional:      General: She is not in acute distress.    Appearance: She is well-developed. She is not diaphoretic.  HENT:     Head: Normocephalic and atraumatic.  Eyes:     Conjunctiva/sclera: Conjunctivae normal.     Pupils: Pupils are equal, round, and reactive to light.  Neck:     Musculoskeletal: Normal range of motion and neck supple.  Pulmonary:     Effort: Pulmonary effort is normal. No respiratory distress.  Skin:    General: Skin is warm and dry.  Neurological:     Mental Status: She is alert and oriented to person, place, and time.      UC Treatments / Results  Labs (all labs ordered are listed, but only abnormal results are displayed) Labs Reviewed - No data to display  EKG None  Radiology No results found.  Procedures Procedures (including critical care time)  Medications Ordered in UC Medications - No data to display  Initial Impression / Assessment and Plan / UC Course  I have reviewed the triage vital signs and the nursing notes.  Pertinent labs & imaging results that were available during my care of the patient were reviewed by me  and considered in my medical decision making (see chart for details).    Given patient with history of miscarriage without full evaluation. Will have patient follow up with PCP/GYN for further evaluation prior to starting birth control. Discussed condom use at this time as birth control. Resources provided. Patient expresses understanding and agrees to plan.  Final Clinical Impressions(s) / UC Diagnoses   Final diagnoses:  Birth control counseling   ED Prescriptions    None       Belinda Fisher, PA-C 02/03/19 1356

## 2019-02-03 NOTE — Discharge Instructions (Signed)
As discussed, please follow up with [facility] or PCP to start birth control. You can use condoms in the meantime.

## 2019-02-03 NOTE — ED Notes (Signed)
Patient able to ambulate independently  

## 2019-07-31 DIAGNOSIS — K219 Gastro-esophageal reflux disease without esophagitis: Secondary | ICD-10-CM | POA: Insufficient documentation

## 2020-03-31 ENCOUNTER — Encounter (HOSPITAL_COMMUNITY): Payer: Self-pay | Admitting: Emergency Medicine

## 2020-03-31 ENCOUNTER — Emergency Department (HOSPITAL_COMMUNITY)
Admission: EM | Admit: 2020-03-31 | Discharge: 2020-03-31 | Disposition: A | Discharge status: Home / Self Care | Payer: BLUE CROSS/BLUE SHIELD | Attending: Emergency Medicine | Admitting: Emergency Medicine

## 2020-03-31 ENCOUNTER — Other Ambulatory Visit: Payer: Self-pay

## 2020-03-31 DIAGNOSIS — K0889 Other specified disorders of teeth and supporting structures: Secondary | ICD-10-CM | POA: Insufficient documentation

## 2020-03-31 DIAGNOSIS — H9202 Otalgia, left ear: Secondary | ICD-10-CM | POA: Diagnosis present

## 2020-03-31 MED ORDER — AMOXICILLIN 500 MG PO CAPS: 500.0000 mg | ORAL_CAPSULE | Freq: Three times a day (TID) | ORAL | 0 refills | Status: DC

## 2020-03-31 NOTE — ED Triage Notes (Signed)
Patient reports left ear ache and left lower molar pain this week , denies fever , no hearing loss or drainage .

## 2020-03-31 NOTE — ED Provider Notes (Signed)
[facility] EMERGENCY DEPARTMENT Provider Note   CSN: [id number] Arrival date & time: 03/31/20  5397     History Chief Complaint  Patient presents with  . Otalgia  . Dental Pain    Jenna Miranda is a 24 y.o. female.  Patient presents with over the last 3 days worsening dental pain on the left lower and left ear pain. No fevers chills or vomiting. No respiratory symptoms. Patient is scheduled in the future.        Past Medical History:  Diagnosis Date  . Medical history non-contributory     There are no problems to display for this patient.   Past Surgical History:  Procedure Laterality Date  . NO PAST SURGERIES       OB History    Gravida  3   Para  2   Term  1   Preterm      AB      Living  2     SAB      TAB      Ectopic      Multiple      Live Births  1           No family history on file.  Social History   Tobacco Use  . Smoking status: Never Smoker  . Smokeless tobacco: Never Used  Substance Use Topics  . Alcohol use: Not Currently  . Drug use: Not Currently    Home Medications Prior to Admission medications   Medication Sig Start Date End Date Taking? Authorizing Provider  acetaminophen (TYLENOL) 325 MG tablet Take 2 tablets (650 mg total) by mouth every 6 (six) hours as needed. 09/25/18   Aviva Signs, CNM  amoxicillin (AMOXIL) 500 MG capsule Take 1 capsule (500 mg total) by mouth 3 (three) times daily. 03/31/20   Blane Ohara, MD  famotidine (PEPCID) 20 MG tablet Take 1 tablet (20 mg total) by mouth 2 (two) times daily. 10/04/18   Marny Lowenstein, PA-C  Prenatal Multivit-Min-Fe-FA (PRENATAL VITAMINS) 0.8 MG tablet Take 1 tablet by mouth daily. 09/25/18   Elson Areas, PA-C    Allergies    Patient has no known allergies.  Review of Systems   Review of Systems  Constitutional: Negative for chills and fever.  HENT: Positive for dental problem. Negative for congestion.   Respiratory:  Negative for shortness of breath.   Cardiovascular: Negative for chest pain.  Gastrointestinal: Negative for abdominal pain and vomiting.  Genitourinary: Negative for dysuria and flank pain.  Musculoskeletal: Negative for back pain, neck pain and neck stiffness.  Skin: Negative for rash.  Neurological: Negative for light-headedness and headaches.    Physical Exam Updated Vital Signs BP 96/75 (BP Location: Right Arm)   Pulse 72   Temp 97.8 F (36.6 C) (Temporal)   Resp 18   LMP 03/20/2020   SpO2 100%   Physical Exam Vitals and nursing note reviewed.  Constitutional:      Appearance: She is well-developed.  HENT:     Head: Normocephalic and atraumatic.     Comments:  left first molar left lower, no abscess or edema externally, no trismus. Minimal left ear effusion without sign of significant infection. No pain with movement of the left ear, canal clear Eyes:     General:        Right eye: No discharge.        Left eye: No discharge.     Conjunctiva/sclera: Conjunctivae normal.  Neck:     Trachea: No tracheal deviation.  Cardiovascular:     Rate and Rhythm: Normal rate.  Pulmonary:     Effort: Pulmonary effort is normal.  Musculoskeletal:     Cervical back: Normal range of motion.  Skin:    General: Skin is warm.     Findings: No rash.  Neurological:     Mental Status: She is alert and oriented to person, place, and time.     ED Results / Procedures / Treatments   Labs (all labs ordered are listed, but only abnormal results are displayed) Labs Reviewed - No data to display  EKG None  Radiology No results found.  Procedures Procedures (including critical care time)  Medications Ordered in ED Medications - No data to display  ED Course  I have reviewed the triage vital signs and the nursing notes.  Pertinent labs & imaging results that were available during my care of the patient were reviewed by me and considered in my medical decision making (see chart  for details).    MDM Rules/Calculators/A&P                          Patient presents with clinical concern for apical abscess/dental infection. No sign of significant swelling patient stable for follow-up with local dentist. Plan for supportive care and starting oral antibiotics. Final Clinical Impression(s) / ED Diagnoses Final diagnoses:  Pain, dental    Rx / DC Orders ED Discharge Orders         Ordered    amoxicillin (AMOXIL) 500 MG capsule  3 times daily     Discontinue  Reprint     03/31/20 1236           Blane Ohara, MD 03/31/20 1242

## 2020-03-31 NOTE — Discharge Instructions (Signed)
Follow up with dentist. Take antibiotics for one week. Take tylenol every 4 hrs and motrin every 6 hrs for pain. Return for facial swelling, persistent fevers or new concerns.

## 2020-07-25 IMAGING — US US OB TRANSVAGINAL
1 series · 15 of 28 positions shown · non-contrast
Comparison: 09/25/2018

CLINICAL DATA: Abdominal pain for 2 days. First trimester pregnancy
with inconclusive fetal viability.

EXAM:
TRANSVAGINAL OB ULTRASOUND
TECHNIQUE: Transvaginal ultrasound was performed for complete evaluation of the
gestation as well as the maternal uterus, adnexal regions, and
pelvic cul-de-sac.

[Series 1: us ob transvaginal · 42 acquisitions, 15 frames shown]
[im 1/42]
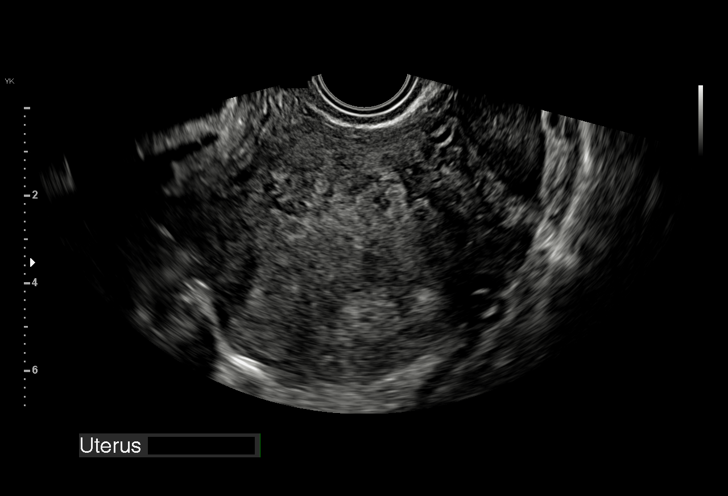
[im 4/42]
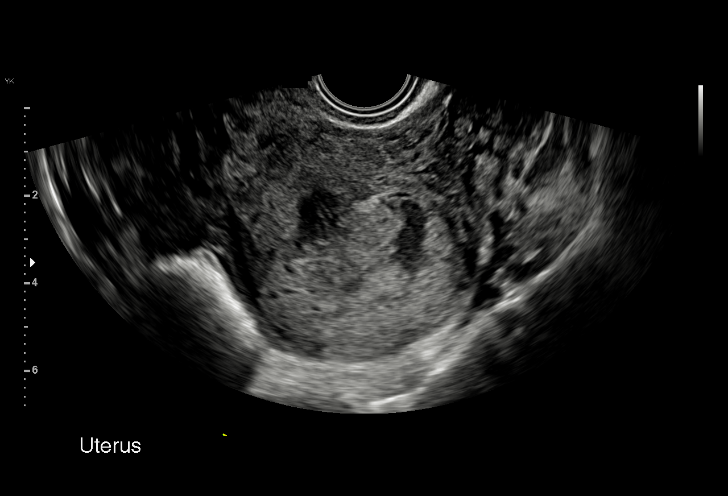
[im 7/42]
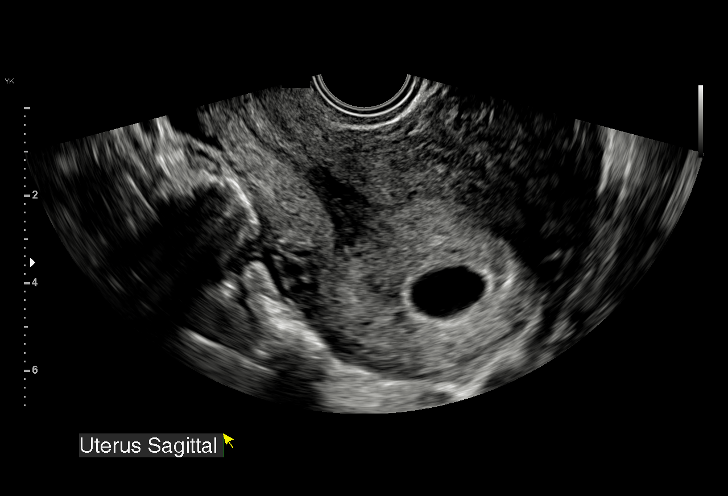
[im 10/42]
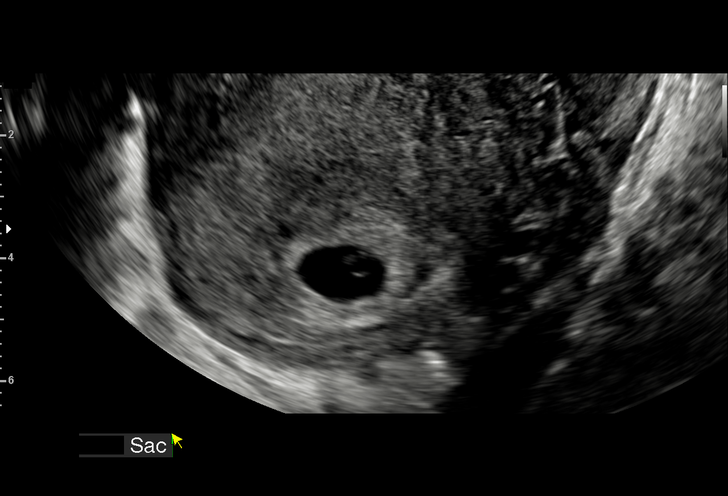
[im 13/42]
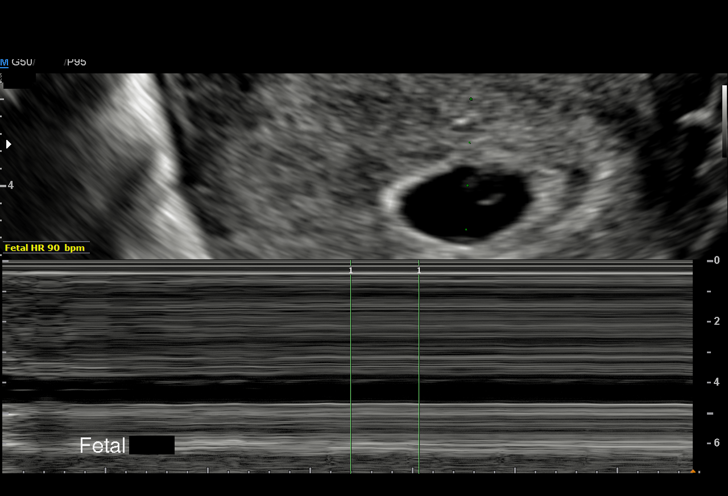
[im 16/42]
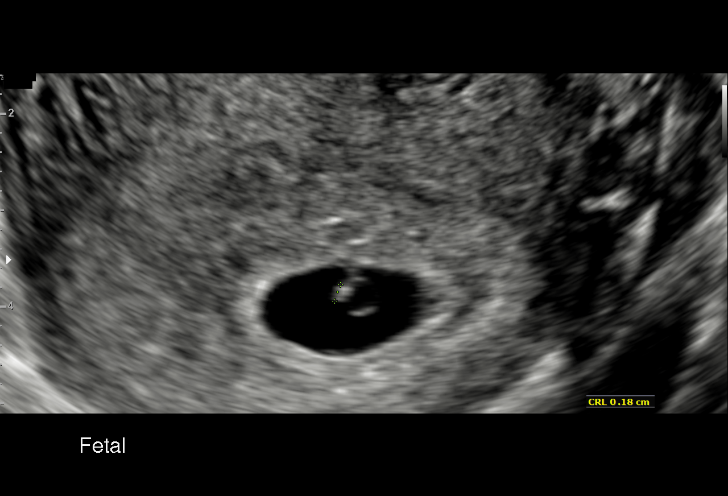
[im 19/42]
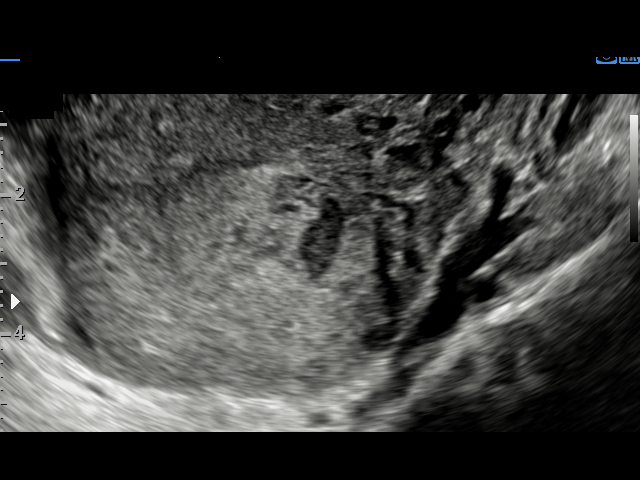
[im 22/42]
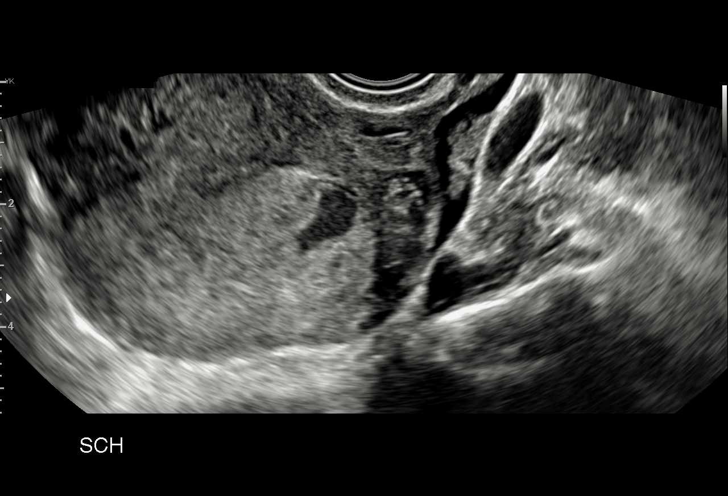
[im 23/42]
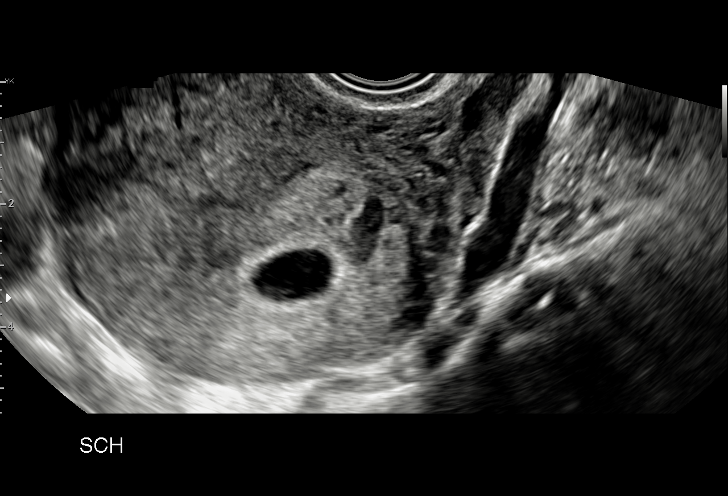
[im 26/42]
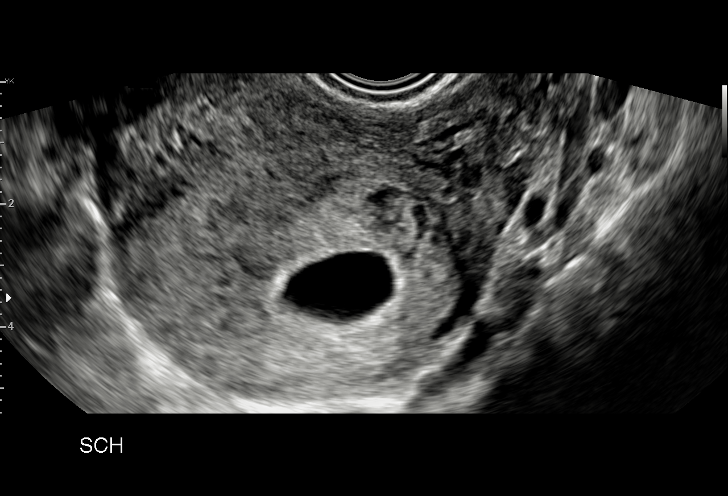
[im 29/42]
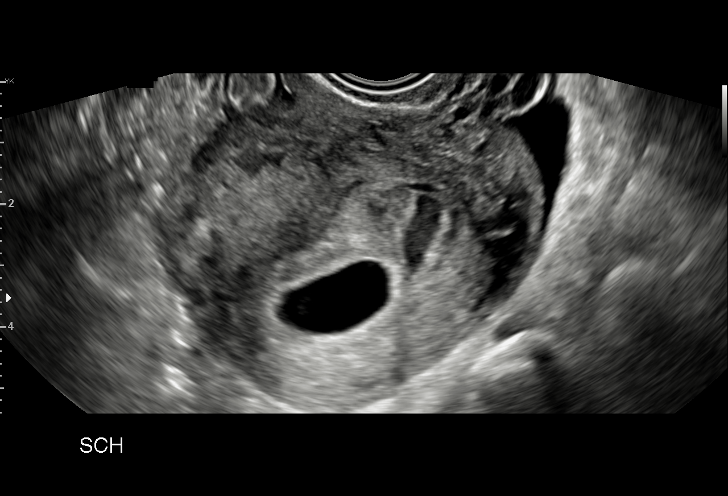
[im 32/42]
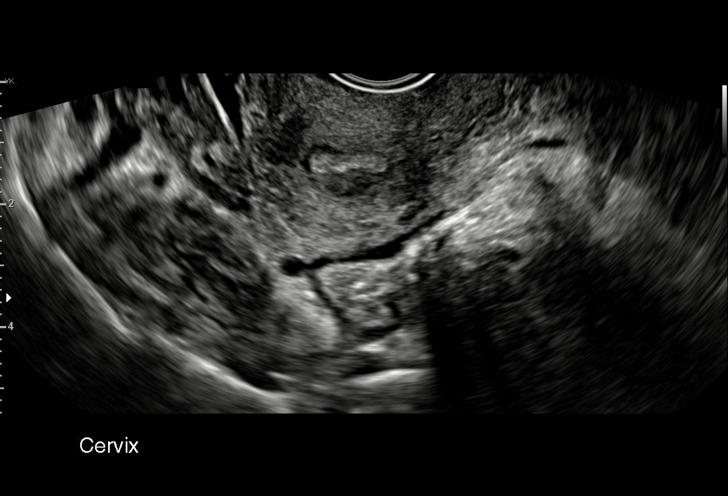
[im 35/42]
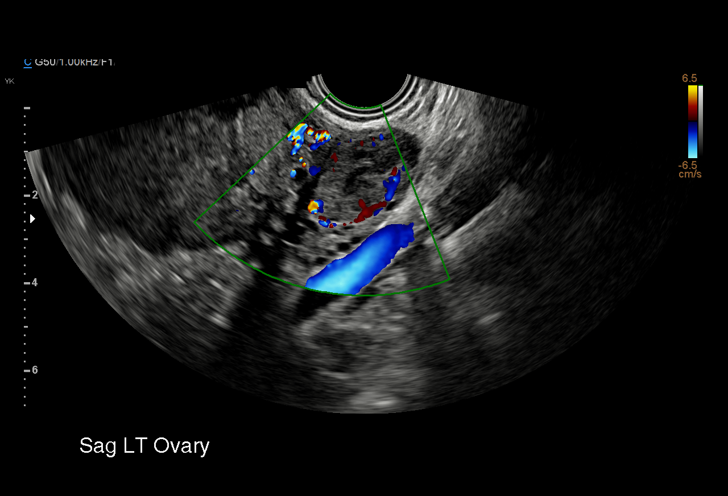
[im 38/42]
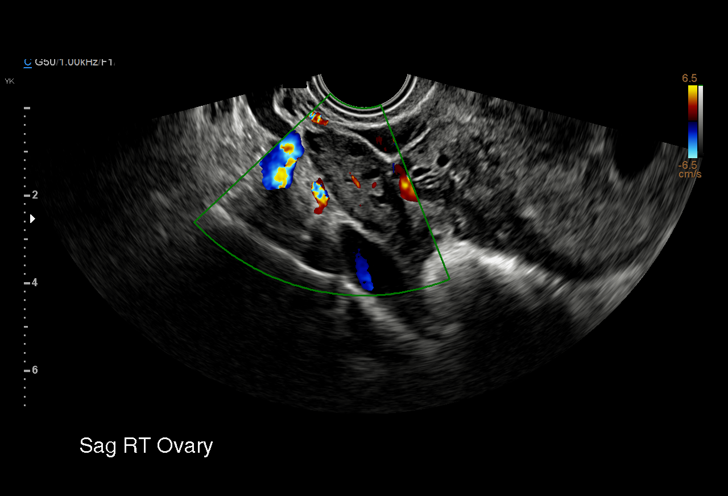
[im 42/42]
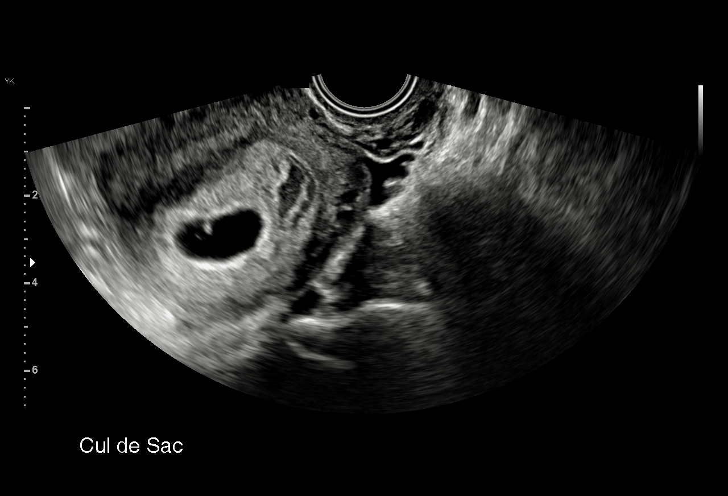

[15 of 28 positions shown; findings below may reference images not displayed]

FINDINGS: Intrauterine gestational sac: Single

Yolk sac:  Visualized.

Embryo:  Visualized.

Cardiac Activity: Visualized.

Heart Rate: 105 bpm

CRL:   2 mm   5 w 5 d                  US EDC: 06/01/2019

Subchorionic hemorrhage:  Small subchorionic hemorrhage noted.

Maternal uterus/adnexae: Both ovaries are normal in appearance. No
mass or abnormal free fluid identified.
IMPRESSION: Single living IUP measuring 5 weeks 5 days, with US EDC of
06/01/2019.

Small subchorionic hemorrhage noted.

## 2020-10-15 ENCOUNTER — Encounter (HOSPITAL_BASED_OUTPATIENT_CLINIC_OR_DEPARTMENT_OTHER): Payer: Self-pay | Admitting: Emergency Medicine

## 2020-10-15 ENCOUNTER — Other Ambulatory Visit: Payer: Self-pay

## 2020-10-15 ENCOUNTER — Emergency Department (HOSPITAL_BASED_OUTPATIENT_CLINIC_OR_DEPARTMENT_OTHER)
Admission: EM | Admit: 2020-10-15 | Discharge: 2020-10-15 | Disposition: A | Discharge status: Home / Self Care | Payer: BLUE CROSS/BLUE SHIELD | Attending: Emergency Medicine | Admitting: Emergency Medicine

## 2020-10-15 ENCOUNTER — Emergency Department (HOSPITAL_BASED_OUTPATIENT_CLINIC_OR_DEPARTMENT_OTHER): Payer: BLUE CROSS/BLUE SHIELD

## 2020-10-15 DIAGNOSIS — Z3A15 15 weeks gestation of pregnancy: Secondary | ICD-10-CM | POA: Insufficient documentation

## 2020-10-15 DIAGNOSIS — E876 Hypokalemia: Secondary | ICD-10-CM

## 2020-10-15 DIAGNOSIS — R109 Unspecified abdominal pain: Secondary | ICD-10-CM | POA: Insufficient documentation

## 2020-10-15 DIAGNOSIS — Z3490 Encounter for supervision of normal pregnancy, unspecified, unspecified trimester: Secondary | ICD-10-CM

## 2020-10-15 DIAGNOSIS — O219 Vomiting of pregnancy, unspecified: Secondary | ICD-10-CM | POA: Insufficient documentation

## 2020-10-15 LAB — PREGNANCY, URINE: Preg Test, Ur: POSITIVE — AB

## 2020-10-15 LAB — WET PREP, GENITAL
Clue Cells Wet Prep HPF POC: NONE SEEN
Sperm: NONE SEEN
Trich, Wet Prep: NONE SEEN
Yeast Wet Prep HPF POC: NONE SEEN

## 2020-10-15 LAB — COMPREHENSIVE METABOLIC PANEL
ALT: 12 U/L (ref 0–44)
AST: 15 U/L (ref 15–41)
Albumin: 3.8 g/dL (ref 3.5–5.0)
Alkaline Phosphatase: 57 U/L (ref 38–126)
Anion gap: 11 (ref 5–15)
BUN: 4 mg/dL — ABNORMAL LOW (ref 6–20)
CO2: 19 mmol/L — ABNORMAL LOW (ref 22–32)
Calcium: 9.1 mg/dL (ref 8.9–10.3)
Chloride: 106 mmol/L (ref 98–111)
Creatinine, Ser: 0.3 mg/dL — ABNORMAL LOW (ref 0.44–1.00)
GFR, Estimated: >60 mL/min (ref >60)
Glucose, Bld: 101 mg/dL — ABNORMAL HIGH (ref 70–99)
Potassium: 3.1 mmol/L — ABNORMAL LOW (ref 3.5–5.1)
Sodium: 136 mmol/L (ref 135–145)
Total Bilirubin: 0.2 mg/dL — ABNORMAL LOW (ref 0.3–1.2)
Total Protein: 7.6 g/dL (ref 6.5–8.1)

## 2020-10-15 LAB — URINALYSIS, MICROSCOPIC (REFLEX)

## 2020-10-15 LAB — CBC
HCT: 39.4 % (ref 36.0–46.0)
Hemoglobin: 14.2 g/dL (ref 12.0–15.0)
MCH: 28.9 pg (ref 26.0–34.0)
MCHC: 36 g/dL (ref 30.0–36.0)
MCV: 80.2 fL (ref 80.0–100.0)
Platelets: 222 10*3/uL (ref 150–400)
RBC: 4.91 MIL/uL (ref 3.87–5.11)
RDW: 12.8 % (ref 11.5–15.5)
WBC: 7.8 10*3/uL (ref 4.0–10.5)
nRBC: 0 % (ref 0.0–0.2)

## 2020-10-15 LAB — URINALYSIS, ROUTINE W REFLEX MICROSCOPIC
Bilirubin Urine: NEGATIVE
Glucose, UA: NEGATIVE mg/dL
Hgb urine dipstick: NEGATIVE
Ketones, ur: >=80 mg/dL — AB
Leukocytes,Ua: NEGATIVE
Nitrite: NEGATIVE
Specific Gravity, Urine: 1.01 (ref 1.005–1.030)
pH: 7.5 (ref 5.0–8.0)

## 2020-10-15 LAB — HCG, QUANTITATIVE, PREGNANCY: hCG, Beta Chain, Quant, S: 61948 m[IU]/mL — ABNORMAL HIGH (ref <5)

## 2020-10-15 LAB — LIPASE, BLOOD: Lipase: 30 U/L (ref 11–51)

## 2020-10-15 MED ORDER — POTASSIUM CHLORIDE CRYS ER 20 MEQ PO TBCR
20.0000 meq | EXTENDED_RELEASE_TABLET | Freq: Once | ORAL | Status: AC
  Administered 2020-10-15: 20 meq via ORAL
  Filled 2020-10-15: qty 1

## 2020-10-15 MED ORDER — SODIUM CHLORIDE 0.9 % IV BOLUS
1000.0000 mL | Freq: Once | INTRAVENOUS | Status: AC
  Administered 2020-10-15: 1000 mL via INTRAVENOUS

## 2020-10-15 MED ORDER — METOCLOPRAMIDE HCL 5 MG/ML IJ SOLN
10.0000 mg | Freq: Once | INTRAMUSCULAR | Status: AC
  Administered 2020-10-15: 10 mg via INTRAVENOUS
  Filled 2020-10-15: qty 2

## 2020-10-15 MED ORDER — DOXYLAMINE-PYRIDOXINE 10-10 MG PO TBEC: 2.0000 | DELAYED_RELEASE_TABLET | Freq: Every evening | ORAL | 0 refills | Status: AC | PRN

## 2020-10-15 MED ORDER — LACTATED RINGERS IV BOLUS
1000.0000 mL | Freq: Once | INTRAVENOUS | Status: AC
  Administered 2020-10-15: 1000 mL via INTRAVENOUS

## 2020-10-15 MED ORDER — MORPHINE SULFATE (PF) 2 MG/ML IV SOLN
2.0000 mg | Freq: Once | INTRAVENOUS | Status: AC
  Administered 2020-10-15: 2 mg via INTRAVENOUS
  Filled 2020-10-15: qty 1

## 2020-10-15 MED ORDER — DIPHENHYDRAMINE HCL 50 MG/ML IJ SOLN
25.0000 mg | Freq: Once | INTRAMUSCULAR | Status: AC
  Administered 2020-10-15: 25 mg via INTRAVENOUS
  Filled 2020-10-15: qty 1

## 2020-10-15 NOTE — ED Triage Notes (Signed)
Pt arrives pov with family, c/o abdominal pain with N/V that started today. Pt endorses 3 months pregnant. Denis vaginal discharge. Language [employer]

## 2020-10-15 NOTE — Discharge Instructions (Addendum)
It was our pleasure to provide your ER care today - we hope that you feel better.  Rest. Drink plenty of fluids. Take diclegis as prescribed, as need for nausea/vomiting.   Your ultrasound shows a 14 week and 6 day pregnancy - follow up with ob/gyn doctor in the coming week - call office Monday to arrange appointment time. Take a prenatal vitamin once per day.   The lab tests show your potassium level is mildly low -  eat plenty of fruits and vegetables, and follow up with primary care doctor in 1 week.   If problems with pregnancy (such as vaginal bleeding, pelvic pain, vomiting) - go directly to the [facility].   Return to ER if worse, new symptoms, fevers, new or severe pain, severe abdominal pain, or other concern.   You were given medication in the ER - no driving for the next 6 hours.

## 2020-10-15 NOTE — ED Provider Notes (Signed)
[facility] EMERGENCY DEPARTMENT Provider Note   CSN: [id number] Arrival date & time: 10/15/20  1849     History Chief Complaint  Patient presents with  . Abdominal Pain    Pregnant    Jenna Miranda is a 25 y.o. female.  Patient c/o abd pain.  Patient  G4P2, presents indicating is ~ 3 months pregnant and is having abd pain and nausea/vomiting. Symptoms acute onset in past 1-2 days, moderate/severe, constant, dull, non-radiating. No prenatal care with this pregnancy (formerly lived in [place], and [place]). Last period was some time in October, and indicates felt fine up until past couple of days. No specific known ill contacts. No diarrhea. No vaginal discharge or bleeding. No fever. No dysuria. Pt speaks limited [place] and Hausa. Spouse is in room interpreting. No prior abd surgery. No abd distension.   The history is provided by the patient and the spouse.  Abdominal Pain Associated symptoms: nausea and vomiting   Associated symptoms: no chest pain, no cough, no dysuria, no fever, no shortness of breath, no sore throat and no vaginal bleeding      Past Medical History:  Diagnosis Date  . Medical history non-contributory     There are no problems to display for this patient.   Past Surgical History:  Procedure Laterality Date  . NO PAST SURGERIES       OB History    Gravida  4   Para  2   Term  1   Preterm      AB      Living  2     SAB      IAB      Ectopic      Multiple      Live Births  1           History reviewed. No pertinent family history.  Social History   Tobacco Use  . Smoking status: Never Smoker  . Smokeless tobacco: Never Used  Substance Use Topics  . Alcohol use: Not Currently  . Drug use: Not Currently    Home Medications Prior to Admission medications   Medication Sig Start Date End Date Taking? Authorizing Provider  acetaminophen (TYLENOL) 325 MG tablet Take 2 tablets (650 mg total) by mouth every 6 (six)  hours as needed. 09/25/18   Aviva Signs, CNM  amoxicillin (AMOXIL) 500 MG capsule Take 1 capsule (500 mg total) by mouth 3 (three) times daily. 03/31/20   Blane Ohara, MD  famotidine (PEPCID) 20 MG tablet Take 1 tablet (20 mg total) by mouth 2 (two) times daily. 10/04/18   Marny Lowenstein, PA-C  Prenatal Multivit-Min-Fe-FA (PRENATAL VITAMINS) 0.8 MG tablet Take 1 tablet by mouth daily. 09/25/18   Elson Areas, PA-C    Allergies    Patient has no known allergies.  Review of Systems   Review of Systems  Constitutional: Negative for fever.  HENT: Negative for sore throat.   Eyes: Negative for redness.  Respiratory: Negative for cough and shortness of breath.   Cardiovascular: Negative for chest pain.  Gastrointestinal: Positive for abdominal pain, nausea and vomiting.  Genitourinary: Negative for dysuria, flank pain and vaginal bleeding.  Musculoskeletal: Negative for back pain and neck pain.  Skin: Negative for rash.  Neurological: Negative for headaches.  Hematological: Does not bruise/bleed easily.  Psychiatric/Behavioral: Negative for confusion.    Physical Exam Updated Vital Signs BP 126/72 (BP Location: Right Arm)   Pulse 91   Temp 98.3 F (  36.8 C) (Oral)   Resp 20   Ht 1.702 m (5\' 7" )   Wt 77.1 kg   LMP 07/01/2020   SpO2 100%   BMI 26.63 kg/m   Physical Exam Vitals and nursing note reviewed.  Constitutional:      Appearance: Normal appearance. She is well-developed.  HENT:     Head: Atraumatic.     Nose: Nose normal.     Mouth/Throat:     Mouth: Mucous membranes are moist.  Eyes:     General: No scleral icterus.    Conjunctiva/sclera: Conjunctivae normal.     Pupils: Pupils are equal, round, and reactive to light.  Neck:     Trachea: No tracheal deviation.  Cardiovascular:     Rate and Rhythm: Normal rate and regular rhythm.     Pulses: Normal pulses.     Heart sounds: Normal heart sounds. No murmur heard. No friction rub. No gallop.   Pulmonary:      Effort: Pulmonary effort is normal. No respiratory distress.     Breath sounds: Normal breath sounds.  Abdominal:     General: Bowel sounds are normal. There is no distension.     Palpations: Abdomen is soft.     Tenderness: There is abdominal tenderness. There is no guarding or rebound.     Hernia: No hernia is present.     Comments: Mid to lower abd tenderness, no peritoneal signs.   Genitourinary:    Comments: No cva tenderness.  Pelvic exam: external gu exam normal. No vaginal discharge or bleeding. Cervix closed. No CMT. No focal adnexal mass or tenderness.  Musculoskeletal:        General: No swelling.     Cervical back: Normal range of motion and neck supple. No rigidity. No muscular tenderness.  Skin:    General: Skin is warm and dry.     Findings: No rash.  Neurological:     Mental Status: She is alert.     Comments: Alert, speech normal.   Psychiatric:        Mood and Affect: Mood normal.     ED Results / Procedures / Treatments   Labs (all labs ordered are listed, but only abnormal results are displayed) Results for orders placed or performed during the hospital encounter of 10/15/20  Wet prep, genital   Specimen: Vaginal; Genital  Result Value Ref Range   Yeast Wet Prep HPF POC NONE SEEN NONE SEEN   Trich, Wet Prep NONE SEEN NONE SEEN   Clue Cells Wet Prep HPF POC NONE SEEN NONE SEEN   WBC, Wet Prep HPF POC MODERATE (A) NONE SEEN   Sperm NONE SEEN   Lipase, blood  Result Value Ref Range   Lipase 30 11 - 51 U/L  Comprehensive metabolic panel  Result Value Ref Range   Sodium 136 135 - 145 mmol/L   Potassium 3.1 (L) 3.5 - 5.1 mmol/L   Chloride 106 98 - 111 mmol/L   CO2 19 (L) 22 - 32 mmol/L   Glucose, Bld 101 (H) 70 - 99 mg/dL   BUN 4 (L) 6 - 20 mg/dL   Creatinine, Ser 12/13/20 (L) 0.44 - 1.00 mg/dL   Calcium 9.1 8.9 - 6.44 mg/dL   Total Protein 7.6 6.5 - 8.1 g/dL   Albumin 3.8 3.5 - 5.0 g/dL   AST 15 15 - 41 U/L   ALT 12 0 - 44 U/L   Alkaline  Phosphatase 57 38 - 126 U/L   Total  Bilirubin 0.2 (L) 0.3 - 1.2 mg/dL   GFR, Estimated >16 >10 mL/min   Anion gap 11 5 - 15  CBC  Result Value Ref Range   WBC 7.8 4.0 - 10.5 K/uL   RBC 4.91 3.87 - 5.11 MIL/uL   Hemoglobin 14.2 12.0 - 15.0 g/dL   HCT 96.0 45.4 - 09.8 %   MCV 80.2 80.0 - 100.0 fL   MCH 28.9 26.0 - 34.0 pg   MCHC 36.0 30.0 - 36.0 g/dL   RDW 11.9 14.7 - 82.9 %   Platelets 222 150 - 400 K/uL   nRBC 0.0 0.0 - 0.2 %  Urinalysis, Routine w reflex microscopic  Result Value Ref Range   Color, Urine YELLOW YELLOW   APPearance CLEAR CLEAR   Specific Gravity, Urine 1.010 1.005 - 1.030   pH 7.5 5.0 - 8.0   Glucose, UA NEGATIVE NEGATIVE mg/dL   Hgb urine dipstick NEGATIVE NEGATIVE   Bilirubin Urine NEGATIVE NEGATIVE   Ketones, ur >=80 (A) NEGATIVE mg/dL   Protein, ur TRACE (A) NEGATIVE mg/dL   Nitrite NEGATIVE NEGATIVE   Leukocytes,Ua NEGATIVE NEGATIVE  Pregnancy, urine  Result Value Ref Range   Preg Test, Ur POSITIVE (A) NEGATIVE  Urinalysis, Microscopic (reflex)  Result Value Ref Range   RBC / HPF 0-5 0 - 5 RBC/hpf   WBC, UA 0-5 0 - 5 WBC/hpf   Bacteria, UA FEW (A) NONE SEEN   Squamous Epithelial / LPF 0-5 0 - 5   Mucus PRESENT   hCG, quantitative, pregnancy  Result Value Ref Range   hCG, Beta Chain, Quant, S 61,948 (H) <5 mIU/mL   EKG None  Radiology US OB Limited  Result Date: 10/15/2020 CLINICAL DATA:  Pelvic and abdominal pain for 1 day, nausea and vomiting EXAM: LIMITED OBSTETRIC ULTRASOUND FINDINGS: Number of Fetuses: 1 Heart Rate:  153 bpm Movement: Yes Presentation: Breech Placental Location: Posterior and fundal Previa: No Amniotic Fluid (Subjective):  Within normal limits. BPD: 2.8 cm 14 w  6 d MATERNAL FINDINGS: Cervix:  Appears closed. Uterus/Adnexae: Right ovary is not identified. Left ovary is unremarkable measuring 4.0 x 1.8 x 2.3 cm. No free fluid. IMPRESSION: 1. Single live intrauterine pregnancy as above, estimated age 42 weeks and 6 days. This  exam is performed on an emergent basis and does not comprehensively evaluate fetal size, dating, or anatomy; follow-up complete OB US should be considered if further fetal assessment is warranted. Electronically Signed   By: Sharlet Salina M.D.   On: 10/15/2020 21:57    Procedures Procedures   Medications Ordered in ED Medications  sodium chloride 0.9 % bolus 1,000 mL (has no administration in time range)  metoCLOPramide (REGLAN) injection 10 mg (has no administration in time range)  diphenhydrAMINE (BENADRYL) injection 25 mg (has no administration in time range)  morphine 2 MG/ML injection 2 mg (has no administration in time range)    ED Course  I have reviewed the triage vital signs and the nursing notes.  Pertinent labs & imaging results that were available during my care of the patient were reviewed by me and considered in my medical decision making (see chart for details).  MDM Rules/Calculators/A&P                         Labs sent.   Reviewed nursing notes and prior charts for additional history.   No [place] interpreter available via [employer] video/phone interpreter device. Pt does understand English fairly well,  and speaks simple words/phrases. Spouse also interprets/helps facilitate clear communication with patient.   Labs reviewed/interprted by me - u preg positive. Quant pending. U/s ordered.  K low, kcl po.   Additional labs reviewed/interpreted by me - ua neg for uti. +urine ketones c/w dehydration.    Pt is vomiting, emesis is clear to mildly yellow. No bloody or bilious emesis.  Pt c/o pain and nausea/vomiting. Iv ns bolus. Morphine 2 mg iv, reglan iv, benadryl iv.   Additional LR bolus.   U/s reviewed/interpreted by me - 14 week/6 day IUP.    Recheck abd soft, non tender. No recurrent emesis. Trial of po fluids.   Additional recheck, no abd pain, abd soft non tender. No recurrent emesis.   Pt currently appears stable for d/c.   Rec ob/gyn f/u.  Return  precautions provided.      Final Clinical Impression(s) / ED Diagnoses Final diagnoses:  None    Rx / DC Orders ED Discharge Orders    None       Cathren Laine, MD 10/15/20 2241

## 2020-10-15 NOTE — ED Notes (Signed)
Pt given ice water and told to sip slowly. Reports her nausea has resolved.

## 2020-10-15 NOTE — ED Notes (Signed)
US at bedside

## 2020-10-17 LAB — GC/CHLAMYDIA PROBE AMP (~~LOC~~) NOT AT ARMC
Chlamydia: NEGATIVE
Comment: NEGATIVE
Comment: NORMAL
Neisseria Gonorrhea: NEGATIVE

## 2021-01-04 ENCOUNTER — Encounter: Payer: BLUE CROSS/BLUE SHIELD | Admitting: Family Medicine

## 2021-01-04 ENCOUNTER — Inpatient Hospital Stay (HOSPITAL_COMMUNITY)
Admission: AD | Admit: 2021-01-04 | Discharge: 2021-01-04 | Disposition: A | Discharge status: Home / Self Care | Payer: BLUE CROSS/BLUE SHIELD | Attending: Obstetrics and Gynecology | Admitting: Obstetrics and Gynecology

## 2021-01-04 ENCOUNTER — Other Ambulatory Visit: Payer: Self-pay

## 2021-01-04 ENCOUNTER — Encounter (HOSPITAL_COMMUNITY): Payer: Self-pay | Admitting: Obstetrics and Gynecology

## 2021-01-04 ENCOUNTER — Encounter: Payer: Self-pay | Admitting: Family Medicine

## 2021-01-04 DIAGNOSIS — Z3A26 26 weeks gestation of pregnancy: Secondary | ICD-10-CM | POA: Insufficient documentation

## 2021-01-04 DIAGNOSIS — Z711 Person with feared health complaint in whom no diagnosis is made: Secondary | ICD-10-CM

## 2021-01-04 DIAGNOSIS — O0932 Supervision of pregnancy with insufficient antenatal care, second trimester: Secondary | ICD-10-CM | POA: Insufficient documentation

## 2021-01-04 DIAGNOSIS — O26892 Other specified pregnancy related conditions, second trimester: Secondary | ICD-10-CM | POA: Insufficient documentation

## 2021-01-04 NOTE — MAU Note (Signed)
. .  Jenna Miranda is a 25 y.o. at [redacted]w[redacted]d here in MAU reporting: wanted to check up on the baby because she has not had any prenatal care. Denies pain or any pregnancy complications at this time. No vb or LOF. Endorses good fetal movement.   Pain score: 0 Vitals:   01/04/21 0906  BP: 117/72  Pulse: 74  Resp: 13  Temp: 98.3 F (36.8 C)  SpO2: 100%     FHT:[id number]

## 2021-01-04 NOTE — MAU Provider Note (Signed)
Event Date/Time   First Provider Initiated Contact with Patient 01/04/21 [phone]      S Jenna Miranda is a 25 y.o. [phone] patient who presents to MAU today without complaint and with desire for "check up."  Patient states she is pregnant and has not received prenatal care.  However, patient does not state why she has not received care.  She endorses having 2 kids at home, but no problems with those pregnancies.  She endorses safety at home and denies harm.   O BP 117/72 (BP Location: Right Arm)   Pulse 74   Temp 98.3 F (36.8 C) (Oral)   Resp 13   Wt 75.5 kg   LMP 07/01/2020   SpO2 100%   BMI 26.08 kg/m  Physical Exam Vitals reviewed.  Constitutional:      Appearance: Normal appearance.  Eyes:     Conjunctiva/sclera: Conjunctivae normal.  Cardiovascular:     Rate and Rhythm: Normal rate and regular rhythm.     Pulses: Normal pulses.     Heart sounds: Normal heart sounds.  Pulmonary:     Effort: Pulmonary effort is normal. No respiratory distress.     Breath sounds: Normal breath sounds.  Abdominal:     General: Bowel sounds are normal.     Palpations: Abdomen is soft.     Tenderness: There is no abdominal tenderness.  Musculoskeletal:        General: Normal range of motion.     Cervical back: Normal range of motion.  Skin:    General: Skin is warm and dry.  Neurological:     Mental Status: She is alert and oriented to person, place, and time.  Psychiatric:        Mood and Affect: Mood normal.        Behavior: Behavior normal.        Thought Content: Thought content normal.     A Medical screening exam complete No PNC SIUP at 26.3 weeks  P Informed that MAU is not setting for prenatal care. Discussed scheduling appt with [facility] for initiation of care.  Patient agreeable. Message sent to [facility] for NOB appt. Precautions Given.  Discharge from MAU in stable condition Patient may return to MAU as needed for emergent pregnancy related conditions.   Gerrit Heck, CNM 01/04/2021 9:16 AM

## 2021-01-04 NOTE — Discharge Instructions (Signed)

## 2021-01-12 ENCOUNTER — Ambulatory Visit (INDEPENDENT_AMBULATORY_CARE_PROVIDER_SITE_OTHER): Payer: BLUE CROSS/BLUE SHIELD | Admitting: Family Medicine

## 2021-01-12 DIAGNOSIS — O0990 Supervision of high risk pregnancy, unspecified, unspecified trimester: Secondary | ICD-10-CM | POA: Insufficient documentation

## 2021-01-12 NOTE — Progress Notes (Signed)
Clinical staff could not proceed with visit today due to language interpreter not being available for patient. All options were exhausted in order to help patient, including using [employer] Ipad and calling Language [employer] by phone. Wait time was 30 plus. Clinical staff decided to reschedule patient's visit with [place] and a note has been made about language assistance needed    Jenna Miranda, CMA   01/12/21

## 2021-01-15 ENCOUNTER — Encounter: Payer: Self-pay | Admitting: Nurse Practitioner

## 2021-01-16 ENCOUNTER — Ambulatory Visit (INDEPENDENT_AMBULATORY_CARE_PROVIDER_SITE_OTHER): Payer: BLUE CROSS/BLUE SHIELD | Admitting: Nurse Practitioner

## 2021-01-16 ENCOUNTER — Encounter: Payer: Self-pay | Admitting: Nurse Practitioner

## 2021-01-16 ENCOUNTER — Other Ambulatory Visit: Payer: Self-pay | Admitting: Nurse Practitioner

## 2021-01-16 ENCOUNTER — Other Ambulatory Visit: Payer: Self-pay

## 2021-01-16 VITALS — BP 118/74 | HR 99 | Wt 168.3 lb

## 2021-01-16 DIAGNOSIS — O0990 Supervision of high risk pregnancy, unspecified, unspecified trimester: Secondary | ICD-10-CM | POA: Diagnosis not present

## 2021-01-16 DIAGNOSIS — O0933 Supervision of pregnancy with insufficient antenatal care, third trimester: Secondary | ICD-10-CM | POA: Diagnosis not present

## 2021-01-16 DIAGNOSIS — Z23 Encounter for immunization: Secondary | ICD-10-CM | POA: Diagnosis not present

## 2021-01-16 DIAGNOSIS — Z789 Other specified health status: Secondary | ICD-10-CM

## 2021-01-16 DIAGNOSIS — Z3A28 28 weeks gestation of pregnancy: Secondary | ICD-10-CM

## 2021-01-16 DIAGNOSIS — O2343 Unspecified infection of urinary tract in pregnancy, third trimester: Secondary | ICD-10-CM

## 2021-01-16 MED ORDER — VITAFOL ULTRA 29-0.6-0.4-200 MG PO CAPS: 1.0000 | ORAL_CAPSULE | Freq: Every day | ORAL | 11 refills | Status: AC

## 2021-01-16 NOTE — Progress Notes (Addendum)
Subjective:   Jenna Miranda is a 25 y.o. [phone] at [redacted]w[redacted]d by early ultrasound being seen today for her first obstetrical visit.  Her obstetrical history is significant for late to prenatal care, language barrier, and will be her first birth in [place]. Patient does intend to breast feed. Pregnancy history fully reviewed.  Patient reports occasional reflux.  HISTORY: OB History  Gravida Para Term Preterm AB Living  4 1 1  0 1 2  SAB IAB Ectopic Multiple Live Births  1 0 0 0 2    # Outcome Date GA Lbr Len/2nd Weight Sex Delivery Anes PTL Lv  4 Current           3 Term 2019    M Vag-Spont   LIV  2 Gravida 2016    F Vag-Spont   LIV  1 SAB            Past Medical History:  Diagnosis Date  . Medical history non-contributory    Past Surgical History:  Procedure Laterality Date  . NO PAST SURGERIES     History reviewed. No pertinent family history. Social History   Tobacco Use  . Smoking status: Never Smoker  . Smokeless tobacco: Never Used  Substance Use Topics  . Alcohol use: Not Currently  . Drug use: Not Currently   No Known Allergies Current Outpatient Medications on File Prior to Visit  Medication Sig Dispense Refill  . acetaminophen (TYLENOL) 325 MG tablet Take 2 tablets (650 mg total) by mouth every 6 (six) hours as needed. 30 tablet 0  . ergocalciferol (VITAMIN D2) 1.25 MG (50000 UT) capsule Take 1 capsule by mouth once a week.    . Doxylamine-Pyridoxine 10-10 MG TBEC Take 2 tablets by mouth at bedtime as needed. For nausea (Patient not taking: Reported on 01/16/2021) 20 tablet 0  . famotidine (PEPCID) 20 MG tablet Take 1 tablet (20 mg total) by mouth 2 (two) times daily. (Patient not taking: Reported on 01/16/2021) 60 tablet 1  . methocarbamol (ROBAXIN) 500 MG tablet Take by mouth. (Patient not taking: Reported on 01/16/2021)    . naproxen (NAPROSYN) 500 MG tablet Take by mouth. (Patient not taking: Reported on 01/16/2021)    . Prenatal Multivit-Min-Fe-FA  (PRENATAL VITAMINS) 0.8 MG tablet Take 1 tablet by mouth daily. (Patient not taking: Reported on 01/16/2021) 90 tablet 2   No current facility-administered medications on file prior to visit.     Exam   Vitals:   01/16/21 1053  BP: 118/74  Pulse: 99  Weight: 168 lb 4.8 oz (76.3 kg)   Fetal Heart Rate (bpm): 147  Uterus:  Fundal Height: 27 cm  Pelvic Exam: Perineum: Pelvic deferred    Vulva:    Vagina:     Cervix:    Adnexa:    Bony Pelvis:   System: General: well-developed, well-nourished female in no acute distress   Breast:  normal appearance, no masses or tenderness   Skin: normal coloration and turgor, no rashes   Neurologic: oriented, normal, negative, normal mood   Extremities: normal strength, tone, and muscle mass, ROM of all joints is normal   HEENT extraocular movement intact and sclera clear, anicteric   Mouth/Teeth deferred   Neck supple and no masses, normal thyroid   Cardiovascular: regular rate and rhythm   Respiratory:  no respiratory distress, normal breath sounds   Abdomen: soft, non-tender; no masses,  no organomegaly     Assessment:   Pregnancy: 03/18/21 Patient Active Problem  List   Diagnosis Date Noted  . Supervision of high risk pregnancy, antepartum 01/12/2021  . Gastroesophageal reflux disease without esophagitis 07/31/2019     Plan:  1. Supervision of high risk pregnancy, antepartum Was not fasting today - will schedule for glucola labs in one week Saw navigator today Explained that female provider will most likely care for them as that is her preference, but that not all of our providers are female and if needed especially in an emergency, they may have have a female provider. Two previous vaginal births with no problems in those pregnancies.  - Culture, OB Urine - RPR - Hemoglobin A1c - Genetic Screening - CBC - HIV Antibody (routine testing w rflx); Future - Hepatitis C Antibody; Future - Hepatitis B Surface AntiGEN; Future - Rubella  Antibody, IgM; Future - Glucose Tolerance, 2 Hours w/1 Hour; Future  2. Language barrier In person interpreter present for the entire visit  3. Late prenatal care affecting pregnancy in third trimester Had [place] at 14 weeks in [facility]   Initial labs drawn. Continue prenatal vitamins. Genetic Screening discussed, NIPS: ordered. Ultrasound discussed; fetal anatomic survey: ordered. Problem list reviewed and updated. The nature of [place] [facility] with multiple MDs and other Advanced Practice Providers was explained to patient; also emphasized that residents, students are part of our team. Routine obstetric precautions reviewed. Return in about 1 week (around 01/23/2021) for early am for fasting glucose and in 2 weeks for ROB.  Total face-to-face time with patient: 40 minutes.  Over 50% of encounter was spent on counseling and coordination of care.     Nolene Bernheim, FNP Family Nurse Practitioner, [facility] for [employer], [facility] 01/16/2021 12:30 PM

## 2021-01-17 LAB — RPR: RPR Ser Ql: NONREACTIVE

## 2021-01-17 LAB — CBC
Hematocrit: 35.3 % (ref 34.0–46.6)
Hemoglobin: 12.1 g/dL (ref 11.1–15.9)
MCH: 28.5 pg (ref 26.6–33.0)
MCHC: 34.3 g/dL (ref 31.5–35.7)
MCV: 83 fL (ref 79–97)
Platelets: 180 10*3/uL (ref 150–450)
RBC: 4.25 x10E6/uL (ref 3.77–5.28)
RDW: 13.1 % (ref 11.7–15.4)
WBC: 5.8 10*3/uL (ref 3.4–10.8)

## 2021-01-17 LAB — HEMOGLOBIN A1C
Est. average glucose Bld gHb Est-mCnc: 103 mg/dL
Hgb A1c MFr Bld: 5.2 % (ref 4.8–5.6)

## 2021-01-20 ENCOUNTER — Telehealth: Payer: Self-pay

## 2021-01-20 DIAGNOSIS — O2343 Unspecified infection of urinary tract in pregnancy, third trimester: Secondary | ICD-10-CM

## 2021-01-20 LAB — URINE CULTURE, OB REFLEX

## 2021-01-20 LAB — CULTURE, OB URINE

## 2021-01-20 MED ORDER — NITROFURANTOIN MONOHYD MACRO 100 MG PO CAPS: 100.0000 mg | ORAL_CAPSULE | Freq: Two times a day (BID) | ORAL | 0 refills | Status: DC

## 2021-01-20 NOTE — Telephone Encounter (Addendum)
-----   Message from Currie Paris, NP sent at 01/20/2021 10:00 AM EDT ----- UTI noted.  No MyChart. Please call patient and review diagnosis and medication available for her at her pharmacy.  Called pt with [facility] # [phone] and informed pt that she has a UTI and that a prescription of Macrobid has been sent to her [facility] on [address]    Addison Naegeli, RN 01/20/21

## 2021-01-20 NOTE — Addendum Note (Signed)
Addended by: Currie Paris on: 01/20/2021 10:02 AM   Modules accepted: Orders

## 2021-01-23 ENCOUNTER — Other Ambulatory Visit: Payer: Self-pay

## 2021-01-23 ENCOUNTER — Other Ambulatory Visit: Payer: BLUE CROSS/BLUE SHIELD

## 2021-01-23 DIAGNOSIS — O0990 Supervision of high risk pregnancy, unspecified, unspecified trimester: Secondary | ICD-10-CM

## 2021-01-24 LAB — HEPATITIS C ANTIBODY: Hep C Virus Ab: <0.1 s/co ratio (ref 0.0–0.9)

## 2021-01-24 LAB — GLUCOSE TOLERANCE, 2 HOURS W/ 1HR
Glucose, 1 hour: 140 mg/dL (ref 65–179)
Glucose, 2 hour: 118 mg/dL (ref 65–152)
Glucose, Fasting: 78 mg/dL (ref 65–91)

## 2021-01-24 LAB — HEPATITIS B SURFACE ANTIGEN: Hepatitis B Surface Ag: NEGATIVE

## 2021-01-24 LAB — HIV ANTIBODY (ROUTINE TESTING W REFLEX): HIV Screen 4th Generation wRfx: NONREACTIVE

## 2021-01-24 LAB — RUBELLA ANTIBODY, IGM: Rubella IgM: <20 AU/mL (ref 0.0–19.9)

## 2021-01-30 ENCOUNTER — Telehealth: Payer: Self-pay

## 2021-01-30 NOTE — Telephone Encounter (Signed)
Pt reports to prenatal navigator Misty Stanley that they would like to keep their prenatal care with our office even if out of network for insurance. Called [facility] to notify them pt will not be attending appt today. Pt applied for Medicaid on 01/27/21 ; application pending.

## 2021-02-01 ENCOUNTER — Telehealth: Payer: Self-pay

## 2021-02-01 ENCOUNTER — Encounter: Payer: Self-pay | Admitting: *Deleted

## 2021-02-01 NOTE — Telephone Encounter (Signed)
[employer] prenatal navigator at [facility] made [facility] navigator aware that patient had transferred care to [facility]. Pt was referred due to insurance network after initial OB with [facility]. Patient's husband informed our office on 01/30/21 they did not intend to go to [facility] so the appt was cancelled.   Called pt to clarify. Spoke with patient and husband using [place] interpreter. Husband states that they have applied for Medicaid as encouraged by prenatal navigator but this is still pending. They would like to go to [facility] until Medicaid has been approved, at that time they will contact [facility] with insurance information and to make a follow up appt. If they are not approved for Medicaid, they intend to complete prenatal care and delivery with [facility] so that they will be in network for insurance purposes.

## 2021-02-02 ENCOUNTER — Telehealth: Payer: Self-pay

## 2021-02-02 NOTE — Telephone Encounter (Signed)
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

## 2021-02-03 ENCOUNTER — Encounter: Payer: BLUE CROSS/BLUE SHIELD | Admitting: Medical

## 2021-02-05 ENCOUNTER — Encounter: Payer: Self-pay | Admitting: Nurse Practitioner

## 2021-02-05 DIAGNOSIS — D573 Sickle-cell trait: Secondary | ICD-10-CM | POA: Insufficient documentation

## 2021-02-08 ENCOUNTER — Other Ambulatory Visit: Payer: BLUE CROSS/BLUE SHIELD

## 2021-02-08 ENCOUNTER — Ambulatory Visit: Payer: BLUE CROSS/BLUE SHIELD | Attending: Obstetrics and Gynecology

## 2021-07-01 ENCOUNTER — Emergency Department (HOSPITAL_COMMUNITY): Payer: BLUE CROSS/BLUE SHIELD

## 2021-07-01 ENCOUNTER — Emergency Department (HOSPITAL_COMMUNITY): Payer: Self-pay

## 2021-07-01 ENCOUNTER — Other Ambulatory Visit: Payer: Self-pay

## 2021-07-01 ENCOUNTER — Emergency Department (HOSPITAL_COMMUNITY): Payer: BLUE CROSS/BLUE SHIELD | Admitting: Anesthesiology

## 2021-07-01 ENCOUNTER — Encounter (HOSPITAL_COMMUNITY): Admission: EM | Disposition: A | Discharge status: Home / Self Care | Payer: Self-pay | Source: Home / Self Care

## 2021-07-01 ENCOUNTER — Observation Stay (HOSPITAL_COMMUNITY)
Admission: EM | Admit: 2021-07-01 | Discharge: 2021-07-02 | Disposition: A | Discharge status: Home / Self Care | Payer: BLUE CROSS/BLUE SHIELD | Attending: Emergency Medicine | Admitting: Emergency Medicine

## 2021-07-01 ENCOUNTER — Encounter (HOSPITAL_COMMUNITY): Payer: Self-pay | Admitting: Emergency Medicine

## 2021-07-01 DIAGNOSIS — R1031 Right lower quadrant pain: Secondary | ICD-10-CM

## 2021-07-01 DIAGNOSIS — R509 Fever, unspecified: Secondary | ICD-10-CM

## 2021-07-01 DIAGNOSIS — K3532 Acute appendicitis with perforation and localized peritonitis, without abscess: Secondary | ICD-10-CM | POA: Diagnosis not present

## 2021-07-01 DIAGNOSIS — R102 Pelvic and perineal pain: Secondary | ICD-10-CM

## 2021-07-01 DIAGNOSIS — Z20822 Contact with and (suspected) exposure to covid-19: Secondary | ICD-10-CM | POA: Diagnosis not present

## 2021-07-01 DIAGNOSIS — O3680X0 Pregnancy with inconclusive fetal viability, not applicable or unspecified: Secondary | ICD-10-CM

## 2021-07-01 DIAGNOSIS — Z3201 Encounter for pregnancy test, result positive: Secondary | ICD-10-CM | POA: Insufficient documentation

## 2021-07-01 DIAGNOSIS — Z9889 Other specified postprocedural states: Secondary | ICD-10-CM

## 2021-07-01 HISTORY — PX: LAPAROSCOPIC APPENDECTOMY: SHX408

## 2021-07-01 LAB — COMPREHENSIVE METABOLIC PANEL
ALT: 23 U/L (ref 0–44)
AST: 18 U/L (ref 15–41)
Albumin: 3.7 g/dL (ref 3.5–5.0)
Alkaline Phosphatase: 121 U/L (ref 38–126)
Anion gap: 8 (ref 5–15)
BUN: <5 mg/dL — ABNORMAL LOW (ref 6–20)
CO2: 21 mmol/L — ABNORMAL LOW (ref 22–32)
Calcium: 9.3 mg/dL (ref 8.9–10.3)
Chloride: 104 mmol/L (ref 98–111)
Creatinine, Ser: 0.42 mg/dL — ABNORMAL LOW (ref 0.44–1.00)
GFR, Estimated: >60 mL/min (ref >60)
Glucose, Bld: 129 mg/dL — ABNORMAL HIGH (ref 70–99)
Potassium: 3.6 mmol/L (ref 3.5–5.1)
Sodium: 133 mmol/L — ABNORMAL LOW (ref 135–145)
Total Bilirubin: 0.6 mg/dL (ref 0.3–1.2)
Total Protein: 7.5 g/dL (ref 6.5–8.1)

## 2021-07-01 LAB — CBC
HCT: 41.1 % (ref 36.0–46.0)
Hemoglobin: 13.9 g/dL (ref 12.0–15.0)
MCH: 25.9 pg — ABNORMAL LOW (ref 26.0–34.0)
MCHC: 33.8 g/dL (ref 30.0–36.0)
MCV: 76.7 fL — ABNORMAL LOW (ref 80.0–100.0)
Platelets: 210 10*3/uL (ref 150–400)
RBC: 5.36 MIL/uL — ABNORMAL HIGH (ref 3.87–5.11)
RDW: 20.6 % — ABNORMAL HIGH (ref 11.5–15.5)
WBC: 13.4 10*3/uL — ABNORMAL HIGH (ref 4.0–10.5)
nRBC: 0 % (ref 0.0–0.2)

## 2021-07-01 LAB — URINALYSIS, ROUTINE W REFLEX MICROSCOPIC
Bilirubin Urine: NEGATIVE
Glucose, UA: NEGATIVE mg/dL
Hgb urine dipstick: NEGATIVE
Ketones, ur: 5 mg/dL — AB
Leukocytes,Ua: NEGATIVE
Nitrite: NEGATIVE
Protein, ur: NEGATIVE mg/dL
Specific Gravity, Urine: 1.013 (ref 1.005–1.030)
pH: 5 (ref 5.0–8.0)

## 2021-07-01 LAB — RESP PANEL BY RT-PCR (FLU A&B, COVID) ARPGX2
Influenza A by PCR: NEGATIVE
Influenza B by PCR: NEGATIVE
SARS Coronavirus 2 by RT PCR: NEGATIVE

## 2021-07-01 LAB — LACTIC ACID, PLASMA: Lactic Acid, Venous: 0.8 mmol/L (ref 0.5–1.9)

## 2021-07-01 LAB — LIPASE, BLOOD: Lipase: 27 U/L (ref 11–51)

## 2021-07-01 LAB — HCG, QUANTITATIVE, PREGNANCY: hCG, Beta Chain, Quant, S: 463 m[IU]/mL — ABNORMAL HIGH (ref <5)

## 2021-07-01 SURGERY — APPENDECTOMY, LAPAROSCOPIC
Anesthesia: General

## 2021-07-01 MED ORDER — MORPHINE SULFATE (PF) 4 MG/ML IV SOLN
4.0000 mg | Freq: Once | INTRAVENOUS | Status: DC
  Filled 2021-07-01: qty 1

## 2021-07-01 MED ORDER — FENTANYL CITRATE (PF) 100 MCG/2ML IJ SOLN
INTRAMUSCULAR | Status: DC | PRN
  Administered 2021-07-01: 50 ug via INTRAVENOUS
  Administered 2021-07-01: 100 ug via INTRAVENOUS
  Administered 2021-07-01 (×2): 50 ug via INTRAVENOUS

## 2021-07-01 MED ORDER — HYDROMORPHONE HCL 1 MG/ML IJ SOLN
1.0000 mg | Freq: Once | INTRAMUSCULAR | Status: AC
  Administered 2021-07-01: 1 mg via INTRAVENOUS
  Filled 2021-07-01: qty 1

## 2021-07-01 MED ORDER — FENTANYL CITRATE (PF) 250 MCG/5ML IJ SOLN
INTRAMUSCULAR | Status: AC
  Filled 2021-07-01: qty 5

## 2021-07-01 MED ORDER — SUGAMMADEX SODIUM 200 MG/2ML IV SOLN
INTRAVENOUS | Status: DC | PRN
  Administered 2021-07-01: 200 mg via INTRAVENOUS

## 2021-07-01 MED ORDER — PIPERACILLIN-TAZOBACTAM 3.375 G IVPB 30 MIN
3.3750 g | Freq: Once | INTRAVENOUS | Status: AC
  Administered 2021-07-01: 3.375 g via INTRAVENOUS
  Filled 2021-07-01: qty 50

## 2021-07-01 MED ORDER — ONDANSETRON 4 MG PO TBDP: 4.0000 mg | ORAL_TABLET | Freq: Four times a day (QID) | ORAL | Status: DC | PRN

## 2021-07-01 MED ORDER — MIDAZOLAM HCL 2 MG/2ML IJ SOLN
INTRAMUSCULAR | Status: AC
  Filled 2021-07-01: qty 2

## 2021-07-01 MED ORDER — ACETAMINOPHEN 10 MG/ML IV SOLN
INTRAVENOUS | Status: AC
  Filled 2021-07-01: qty 100

## 2021-07-01 MED ORDER — DOCUSATE SODIUM 100 MG PO CAPS
100.0000 mg | ORAL_CAPSULE | Freq: Two times a day (BID) | ORAL | Status: DC
  Administered 2021-07-02: 100 mg via ORAL
  Filled 2021-07-01: qty 1

## 2021-07-01 MED ORDER — PROPOFOL 10 MG/ML IV BOLUS
INTRAVENOUS | Status: AC
  Filled 2021-07-01: qty 20

## 2021-07-01 MED ORDER — ROCURONIUM BROMIDE 10 MG/ML (PF) SYRINGE
PREFILLED_SYRINGE | INTRAVENOUS | Status: AC
  Filled 2021-07-01: qty 10

## 2021-07-01 MED ORDER — MIDAZOLAM HCL 5 MG/5ML IJ SOLN
INTRAMUSCULAR | Status: DC | PRN
  Administered 2021-07-01: 2 mg via INTRAVENOUS

## 2021-07-01 MED ORDER — DEXAMETHASONE SODIUM PHOSPHATE 10 MG/ML IJ SOLN
INTRAMUSCULAR | Status: DC | PRN
  Administered 2021-07-01: 5 mg via INTRAVENOUS

## 2021-07-01 MED ORDER — ACETAMINOPHEN 325 MG PO TABS
650.0000 mg | ORAL_TABLET | Freq: Once | ORAL | Status: AC
  Administered 2021-07-01: 650 mg via ORAL
  Filled 2021-07-01: qty 2

## 2021-07-01 MED ORDER — SODIUM CHLORIDE 0.9 % IV BOLUS
1000.0000 mL | Freq: Once | INTRAVENOUS | Status: AC
  Administered 2021-07-01: 1000 mL via INTRAVENOUS

## 2021-07-01 MED ORDER — PROPOFOL 10 MG/ML IV BOLUS
INTRAVENOUS | Status: DC | PRN
  Administered 2021-07-01: 160 mg via INTRAVENOUS

## 2021-07-01 MED ORDER — ONDANSETRON HCL 4 MG/2ML IJ SOLN
INTRAMUSCULAR | Status: DC | PRN
  Administered 2021-07-01: 4 mg via INTRAVENOUS

## 2021-07-01 MED ORDER — ACETAMINOPHEN 500 MG PO TABS
1000.0000 mg | ORAL_TABLET | Freq: Four times a day (QID) | ORAL | Status: DC
  Administered 2021-07-02 (×2): 1000 mg via ORAL
  Filled 2021-07-01 (×2): qty 2

## 2021-07-01 MED ORDER — SODIUM CHLORIDE 0.9 % IR SOLN
Status: DC | PRN
  Administered 2021-07-01: 1000 mL

## 2021-07-01 MED ORDER — SODIUM CHLORIDE 0.9 % IV SOLN
2.0000 g | Freq: Every day | INTRAVENOUS | Status: DC
  Administered 2021-07-02: 2 g via INTRAVENOUS
  Filled 2021-07-01: qty 20

## 2021-07-01 MED ORDER — KETOROLAC TROMETHAMINE 15 MG/ML IJ SOLN
30.0000 mg | Freq: Four times a day (QID) | INTRAMUSCULAR | Status: DC
  Administered 2021-07-02: 30 mg via INTRAVENOUS
  Filled 2021-07-01: qty 2

## 2021-07-01 MED ORDER — LIDOCAINE 2% (20 MG/ML) 5 ML SYRINGE
INTRAMUSCULAR | Status: AC
  Filled 2021-07-01: qty 5

## 2021-07-01 MED ORDER — LACTATED RINGERS IV SOLN: INTRAVENOUS | Status: DC

## 2021-07-01 MED ORDER — DEXAMETHASONE SODIUM PHOSPHATE 10 MG/ML IJ SOLN
INTRAMUSCULAR | Status: AC
  Filled 2021-07-01: qty 2

## 2021-07-01 MED ORDER — SUCCINYLCHOLINE CHLORIDE 200 MG/10ML IV SOSY
PREFILLED_SYRINGE | INTRAVENOUS | Status: DC | PRN
  Administered 2021-07-01: 100 mg via INTRAVENOUS

## 2021-07-01 MED ORDER — ENOXAPARIN SODIUM 40 MG/0.4ML IJ SOSY: 40.0000 mg | PREFILLED_SYRINGE | Freq: Every day | INTRAMUSCULAR | Status: DC

## 2021-07-01 MED ORDER — ONDANSETRON HCL 4 MG/2ML IJ SOLN
4.0000 mg | Freq: Once | INTRAMUSCULAR | Status: AC
  Administered 2021-07-01: 4 mg via INTRAVENOUS
  Filled 2021-07-01: qty 2

## 2021-07-01 MED ORDER — ACETAMINOPHEN 10 MG/ML IV SOLN
INTRAVENOUS | Status: DC | PRN
  Administered 2021-07-01: 1000 mg via INTRAVENOUS

## 2021-07-01 MED ORDER — ONDANSETRON HCL 4 MG/2ML IJ SOLN
INTRAMUSCULAR | Status: AC
  Filled 2021-07-01: qty 4

## 2021-07-01 MED ORDER — OXYCODONE HCL 5 MG/5ML PO SOLN
5.0000 mg | ORAL | Status: DC | PRN
  Administered 2021-07-02: 5 mg via ORAL
  Administered 2021-07-02: 10 mg via ORAL
  Filled 2021-07-01: qty 10
  Filled 2021-07-01: qty 5

## 2021-07-01 MED ORDER — LACTATED RINGERS IV SOLN: INTRAVENOUS | Status: DC | PRN

## 2021-07-01 MED ORDER — METHOCARBAMOL 500 MG PO TABS
1000.0000 mg | ORAL_TABLET | Freq: Three times a day (TID) | ORAL | Status: DC
  Administered 2021-07-02: 1000 mg via ORAL
  Filled 2021-07-01: qty 2

## 2021-07-01 MED ORDER — BUPIVACAINE-EPINEPHRINE 0.25% -1:200000 IJ SOLN
INTRAMUSCULAR | Status: DC | PRN
  Administered 2021-07-01: 9 mL

## 2021-07-01 MED ORDER — LIDOCAINE HCL (CARDIAC) PF 100 MG/5ML IV SOSY
PREFILLED_SYRINGE | INTRAVENOUS | Status: DC | PRN
  Administered 2021-07-01: 60 mg via INTRAVENOUS

## 2021-07-01 MED ORDER — FENTANYL CITRATE (PF) 100 MCG/2ML IJ SOLN
25.0000 ug | INTRAMUSCULAR | Status: DC | PRN
  Administered 2021-07-01 (×2): 50 ug via INTRAVENOUS

## 2021-07-01 MED ORDER — METRONIDAZOLE 500 MG/100ML IV SOLN
500.0000 mg | Freq: Two times a day (BID) | INTRAVENOUS | Status: DC
  Administered 2021-07-02: 500 mg via INTRAVENOUS
  Filled 2021-07-01 (×2): qty 100

## 2021-07-01 MED ORDER — PHENYLEPHRINE 40 MCG/ML (10ML) SYRINGE FOR IV PUSH (FOR BLOOD PRESSURE SUPPORT)
PREFILLED_SYRINGE | INTRAVENOUS | Status: AC
  Filled 2021-07-01: qty 10

## 2021-07-01 MED ORDER — FENTANYL CITRATE (PF) 100 MCG/2ML IJ SOLN
INTRAMUSCULAR | Status: AC
  Filled 2021-07-01: qty 2

## 2021-07-01 MED ORDER — 0.9 % SODIUM CHLORIDE (POUR BTL) OPTIME
TOPICAL | Status: DC | PRN
  Administered 2021-07-01: 1000 mL

## 2021-07-01 MED ORDER — MORPHINE SULFATE (PF) 2 MG/ML IV SOLN: 2.0000 mg | INTRAVENOUS | Status: DC | PRN

## 2021-07-01 MED ORDER — BUPIVACAINE-EPINEPHRINE (PF) 0.25% -1:200000 IJ SOLN
INTRAMUSCULAR | Status: AC
  Filled 2021-07-01: qty 30

## 2021-07-01 MED ORDER — ONDANSETRON HCL 4 MG/2ML IJ SOLN
4.0000 mg | Freq: Four times a day (QID) | INTRAMUSCULAR | Status: DC | PRN
  Administered 2021-07-02: 4 mg via INTRAVENOUS
  Filled 2021-07-01: qty 2

## 2021-07-01 MED ORDER — ROCURONIUM BROMIDE 100 MG/10ML IV SOLN
INTRAVENOUS | Status: DC | PRN
  Administered 2021-07-01: 20 mg via INTRAVENOUS
  Administered 2021-07-01: 50 mg via INTRAVENOUS

## 2021-07-01 SURGICAL SUPPLY — 46 items
APPLIER CLIP ROT 10 11.4 M/L (STAPLE)
BAG COUNTER SPONGE SURGICOUNT (BAG) ×2 IMPLANT
BLADE CLIPPER SURG (BLADE) IMPLANT
CANISTER SUCT 3000ML PPV (MISCELLANEOUS) ×2 IMPLANT
CHLORAPREP W/TINT 26 (MISCELLANEOUS) ×2 IMPLANT
CLIP APPLIE ROT 10 11.4 M/L (STAPLE) IMPLANT
COVER SURGICAL LIGHT HANDLE (MISCELLANEOUS) ×2 IMPLANT
CUTTER FLEX LINEAR 45M (STAPLE) ×4 IMPLANT
DERMABOND ADHESIVE PROPEN (GAUZE/BANDAGES/DRESSINGS) ×1
DERMABOND ADVANCED (GAUZE/BANDAGES/DRESSINGS) ×1
DERMABOND ADVANCED .7 DNX12 (GAUZE/BANDAGES/DRESSINGS) ×1 IMPLANT
DERMABOND ADVANCED .7 DNX6 (GAUZE/BANDAGES/DRESSINGS) ×1 IMPLANT
ELECT CAUTERY BLADE 6.4 (BLADE) ×2 IMPLANT
ELECT REM PT RETURN 9FT ADLT (ELECTROSURGICAL) ×2
ELECTRODE REM PT RTRN 9FT ADLT (ELECTROSURGICAL) ×1 IMPLANT
ENDOLOOP SUT PDS II  0 18 (SUTURE)
ENDOLOOP SUT PDS II 0 18 (SUTURE) IMPLANT
GLOVE SURG ENC MOIS LTX SZ6.5 (GLOVE) ×2 IMPLANT
GLOVE SURG PR MICRO ENCORE 7 (GLOVE) ×2 IMPLANT
GLOVE SURG UNDER POLY LF SZ6 (GLOVE) ×2 IMPLANT
GLOVE SURG UNDER POLY LF SZ7.5 (GLOVE) ×2 IMPLANT
GOWN STRL REUS W/ TWL LRG LVL3 (GOWN DISPOSABLE) ×2 IMPLANT
GOWN STRL REUS W/TWL LRG LVL3 (GOWN DISPOSABLE) ×2
KIT BASIN OR (CUSTOM PROCEDURE TRAY) ×2 IMPLANT
KIT TURNOVER KIT B (KITS) ×2 IMPLANT
NEEDLE INSUFFLATION 14GA 120MM (NEEDLE) ×2 IMPLANT
NS IRRIG 1000ML POUR BTL (IV SOLUTION) ×2 IMPLANT
PAD ARMBOARD 7.5X6 YLW CONV (MISCELLANEOUS) ×4 IMPLANT
PENCIL BUTTON HOLSTER BLD 10FT (ELECTRODE) ×2 IMPLANT
POUCH SPECIMEN RETRIEVAL 10MM (ENDOMECHANICALS) ×2 IMPLANT
RELOAD 45 VASCULAR/THIN (ENDOMECHANICALS) IMPLANT
RELOAD STAPLE TA45 3.5 REG BLU (ENDOMECHANICALS) ×2 IMPLANT
SCISSORS LAP 5X35 DISP (ENDOMECHANICALS) IMPLANT
SET IRRIG TUBING LAPAROSCOPIC (IRRIGATION / IRRIGATOR) IMPLANT
SET TUBE SMOKE EVAC HIGH FLOW (TUBING) ×2 IMPLANT
SHEARS HARMONIC ACE PLUS 36CM (ENDOMECHANICALS) ×2 IMPLANT
SLEEVE ENDOPATH XCEL 5M (ENDOMECHANICALS) ×2 IMPLANT
SPECIMEN JAR SMALL (MISCELLANEOUS) ×2 IMPLANT
SUT MNCRL AB 4-0 PS2 18 (SUTURE) ×2 IMPLANT
SUT VICRYL 0 UR6 27IN ABS (SUTURE) ×2 IMPLANT
TOWEL GREEN STERILE (TOWEL DISPOSABLE) ×2 IMPLANT
TOWEL GREEN STERILE FF (TOWEL DISPOSABLE) ×2 IMPLANT
TRAY LAPAROSCOPIC MC (CUSTOM PROCEDURE TRAY) ×2 IMPLANT
TROCAR XCEL BLUNT TIP 100MML (ENDOMECHANICALS) IMPLANT
TROCAR XCEL NON-BLD 5MMX100MML (ENDOMECHANICALS) ×2 IMPLANT
WATER STERILE IRR 1000ML POUR (IV SOLUTION) ×2 IMPLANT

## 2021-07-01 NOTE — Transfer of Care (Signed)
Immediate Anesthesia Transfer of Care Note  Patient: Tenesha Moudidan Maina  Procedure(s) Performed: LAPAROSCOPIC APPENDECTOMY POSSIBLE OPEN  Patient Location: PACU  Anesthesia Type:General  Level of Consciousness: drowsy  Airway & Oxygen Therapy: Patient Spontanous Breathing and Patient connected to nasal cannula oxygen  Post-op Assessment: Report given to RN, Post -op Vital signs reviewed and stable and Patient moving all extremities  Post vital signs: Reviewed and stable  Last Vitals:  Vitals Value Taken Time  BP 147/87 07/01/21 2255  Temp 36.3 C 07/01/21 2240  Pulse 98 07/01/21 2256  Resp 21 07/01/21 2256  SpO2 96 % 07/01/21 2256  Vitals shown include unvalidated device data.  Last Pain:  Vitals:   07/01/21 2255  TempSrc:   PainSc: Asleep      Patients Stated Pain Goal: 0 (07/01/21 2240)  Complications: No notable events documented.

## 2021-07-01 NOTE — Anesthesia Procedure Notes (Signed)
Procedure Name: Intubation Date/Time: 07/01/2021 8:57 PM Performed by: Chiyeko Ferre T, CRNA Pre-anesthesia Checklist: Patient identified, Emergency Drugs available, Suction available and Patient being monitored Patient Re-evaluated:Patient Re-evaluated prior to induction Oxygen Delivery Method: Circle system utilized Preoxygenation: Pre-oxygenation with 100% oxygen Induction Type: IV induction Ventilation: Mask ventilation without difficulty Laryngoscope Size: Miller and 2 Grade View: Grade I Tube type: Oral Tube size: 7.0 mm Number of attempts: 1 Airway Equipment and Method: Stylet and Oral airway Placement Confirmation: ETT inserted through vocal cords under direct vision, positive ETCO2 and breath sounds checked- equal and bilateral Secured at: 22 cm Tube secured with: Tape Dental Injury: Teeth and Oropharynx as per pre-operative assessment

## 2021-07-01 NOTE — H&P (Signed)
Reason for Consult/Chief Complaint: ruptured appendicitis Consultant: Grace Bushy, [place]  Jenna Miranda is an 25 y.o. female.   HPI: 26F with two days of RLQ abdominal pain and back pain. Denies fevers, chills, nausea, vomiting. Reports normal bowel movements. No prior colonoscopy. LMP 05/29/2021. SVD 04/17/2021.  Past Medical History:  Diagnosis Date   Medical history non-contributory     Past Surgical History:  Procedure Laterality Date   NO PAST SURGERIES      No family history on file.  Social History:  reports that she has never smoked. She has never used smokeless tobacco. She reports that she does not currently use alcohol. She reports that she does not currently use drugs.  Allergies: No Known Allergies  Medications: I have reviewed the patient's current medications.  Results for orders placed or performed during the hospital encounter of 07/01/21 (from the past 48 hour(s))  Urinalysis, Routine w reflex microscopic Urine, Clean Catch     Status: Abnormal   Collection Time: 07/01/21  3:40 AM  Result Value Ref Range   Color, Urine YELLOW YELLOW   APPearance HAZY (A) CLEAR   Specific Gravity, Urine 1.013 1.005 - 1.030   pH 5.0 5.0 - 8.0   Glucose, UA NEGATIVE NEGATIVE mg/dL   Hgb urine dipstick NEGATIVE NEGATIVE   Bilirubin Urine NEGATIVE NEGATIVE   Ketones, ur 5 (A) NEGATIVE mg/dL   Protein, ur NEGATIVE NEGATIVE mg/dL   Nitrite NEGATIVE NEGATIVE   Leukocytes,Ua NEGATIVE NEGATIVE    Comment: Performed at [facility], [address], [place], [place] [address]  Lipase, blood     Status: None   Collection Time: 07/01/21  3:42 AM  Result Value Ref Range   Lipase 27 11 - 51 U/L    Comment: Performed at [facility], [address] [place], [place] [address]  Comprehensive metabolic panel     Status: Abnormal   Collection Time: 07/01/21  3:42 AM  Result Value Ref Range   Sodium 133 (L) 135 - 145 mmol/L   Potassium 3.6 3.5 - 5.1 mmol/L   Chloride  104 98 - 111 mmol/L   CO2 21 (L) 22 - 32 mmol/L   Glucose, Bld 129 (H) 70 - 99 mg/dL    Comment: Glucose reference range applies only to samples taken after fasting for at least 8 hours.   BUN <5 (L) 6 - 20 mg/dL   Creatinine, Ser 9.47 (L) 0.44 - 1.00 mg/dL   Calcium 9.3 8.9 - 09.6 mg/dL   Total Protein 7.5 6.5 - 8.1 g/dL   Albumin 3.7 3.5 - 5.0 g/dL   AST 18 15 - 41 U/L   ALT 23 0 - 44 U/L   Alkaline Phosphatase 121 38 - 126 U/L   Total Bilirubin 0.6 0.3 - 1.2 mg/dL   GFR, Estimated >28 >36 mL/min    Comment: (NOTE) Calculated using the CKD-EPI Creatinine Equation (2021)    Anion gap 8 5 - 15    Comment: Performed at [facility], [address] [place], [place] [address]  CBC     Status: Abnormal   Collection Time: 07/01/21  3:42 AM  Result Value Ref Range   WBC 13.4 (H) 4.0 - 10.5 K/uL   RBC 5.36 (H) 3.87 - 5.11 MIL/uL   Hemoglobin 13.9 12.0 - 15.0 g/dL   HCT 65.4 65.0 - 35.4 %   MCV 76.7 (L) 80.0 - 100.0 fL   MCH 25.9 (L) 26.0 -  34.0 pg   MCHC 33.8 30.0 - 36.0 g/dL   RDW 25.8 (H) 52.7 - 78.2 %   Platelets 210 150 - 400 K/uL    Comment: REPEATED TO VERIFY   nRBC 0.0 0.0 - 0.2 %    Comment: Performed at [facility], [address] [place], [place] [address]  Lactic acid, plasma     Status: None   Collection Time: 07/01/21  7:39 AM  Result Value Ref Range   Lactic Acid, Venous 0.8 0.5 - 1.9 mmol/L    Comment: Performed at [facility], [address] [place], [place] [address]  hCG, quantitative, pregnancy     Status: Abnormal   Collection Time: 07/01/21  9:06 AM  Result Value Ref Range   hCG, Beta Chain, Quant, S 463 (H) <5 mIU/mL    Comment:          GEST. AGE      CONC.  (mIU/mL)   <=1 WEEK        5 - 50     2 WEEKS       50 - 500     3 WEEKS       100 - 10,000     4 WEEKS     1,000 - 30,000     5 WEEKS     3,500 - 115,000   6-8 WEEKS     12,000 - 270,000    12 WEEKS     15,000 - 220,000        FEMALE AND NON-PREGNANT FEMALE:     LESS THAN  5 mIU/mL Performed at [facility], [address] [place], [place] [address]   Resp Panel by RT-PCR (Flu A&B, Covid) Nasopharyngeal Swab     Status: None   Collection Time: 07/01/21  4:05 PM   Specimen: Nasopharyngeal Swab; Nasopharyngeal(NP) swabs in vial transport medium  Result Value Ref Range   SARS Coronavirus 2 by RT PCR NEGATIVE NEGATIVE    Comment: (NOTE) SARS-CoV-2 target nucleic acids are NOT DETECTED.  The SARS-CoV-2 RNA is generally detectable in upper respiratory specimens during the acute phase of infection. The lowest concentration of SARS-CoV-2 viral copies this assay can detect is 138 copies/mL. A negative result does not preclude SARS-Cov-2 infection and should not be used as the sole basis for treatment or other patient management decisions. A negative result may occur with  improper specimen collection/handling, submission of specimen other than nasopharyngeal swab, presence of viral mutation(s) within the areas targeted by this assay, and inadequate number of viral copies(<138 copies/mL). A negative result must be combined with clinical observations, patient history, and epidemiological information. The expected result is Negative.  Fact Sheet for Patients:  BloggerCourse.com  Fact Sheet for Healthcare Providers:  SeriousBroker.it  This test is no t yet approved or cleared by the [place] FDA and  has been authorized for detection and/or diagnosis of SARS-CoV-2 by FDA under an Emergency Use Authorization (EUA). This EUA will remain  in effect (meaning this test can be used) for the duration of the COVID-19 declaration under Section 564(b)(1) of the Act, 21 U.S.C.section 360bbb-3(b)(1), unless the authorization is terminated  or revoked sooner.       Influenza A by PCR NEGATIVE NEGATIVE   Influenza B by PCR NEGATIVE NEGATIVE    Comment: (NOTE) The Xpert Xpress SARS-CoV-2/FLU/RSV plus assay is  intended as an aid in the diagnosis of influenza from Nasopharyngeal swab specimens and should not  be used as a sole basis for treatment. Nasal washings and aspirates are unacceptable for Xpert Xpress SARS-CoV-2/FLU/RSV testing.  Fact Sheet for Patients: BloggerCourse.com  Fact Sheet for Healthcare Providers: SeriousBroker.it  This test is not yet approved or cleared by the [place] FDA and has been authorized for detection and/or diagnosis of SARS-CoV-2 by FDA under an Emergency Use Authorization (EUA). This EUA will remain in effect (meaning this test can be used) for the duration of the COVID-19 declaration under Section 564(b)(1) of the Act, 21 U.S.C. section 360bbb-3(b)(1), unless the authorization is terminated or revoked.  Performed at [facility], [address] [place], [place] [address]     MR PELVIS WO CONTRAST  Result Date: 07/01/2021 CLINICAL DATA:  Right lower quadrant abdominal pain with leukocytosis and fever. Positive pregnancy test. Clinical concern for appendicitis. EXAM: MRI ABDOMEN AND PELVIS WITHOUT CONTRAST TECHNIQUE: Multiplanar multisequence MR imaging of the abdomen and pelvis was performed. No intravenous contrast was administered. COMPARISON:  Obstetric ultrasound same date. FINDINGS: COMBINED FINDINGS FOR BOTH MR ABDOMEN AND PELVIS Lower chest:  The visualized lower chest appears unremarkable. Hepatobiliary: The liver is normal in signal, without focal abnormality. No evidence of gallstones, gallbladder wall thickening or biliary dilatation. Pancreas: Unremarkable. No pancreatic ductal dilatation or surrounding inflammatory changes. Spleen: Normal in size without focal abnormality. Adrenals/Urinary Tract: Both adrenal glands appear normal. No evidence of hydronephrosis or focal perinephric fluid collection. The bladder appears unremarkable. Stomach/Bowel: The stomach appears unremarkable for its  degree of distention. No small bowel distension or wall thickening identified. There are extensive inflammatory changes posterior to the cecum which appear contiguous with an ill-defined enlarged appendix, measuring up to 1.4 cm in diameter on image 42/30. There is extensive surrounding ill-defined fluid, suspicious for early rupture. No well-defined abscess identified. The colon appears unremarkable. Vascular/Lymphatic: There are no enlarged abdominal or pelvic lymph nodes. No significant vascular findings on noncontrast imaging. Reproductive: No adnexal mass or uterine abnormality identified. Other: Moderate amount of free pelvic fluid. Musculoskeletal: No acute or significant osseous findings. IMPRESSION: 1. Right lower quadrant inflammatory process most consistent with ruptured acute retrocecal appendicitis. There is ill-defined fluid posterior to the cecum without well-defined abscess. Moderate amount of free pelvic fluid. 2. No evidence of bowel obstruction. 3. The uterus and adnexa appear unremarkable.  No hydronephrosis. 4. These results were called by telephone at the time of interpretation on 07/01/2021 at 5:33 pm to provider Chandra Batch, who verbally acknowledged these results. Electronically Signed   By: Carey Bullocks M.D.   On: 07/01/2021 17:35   MR ABDOMEN WO CONTRAST  Result Date: 07/01/2021 CLINICAL DATA:  Right lower quadrant abdominal pain with leukocytosis and fever. Positive pregnancy test. Clinical concern for appendicitis. EXAM: MRI ABDOMEN AND PELVIS WITHOUT CONTRAST TECHNIQUE: Multiplanar multisequence MR imaging of the abdomen and pelvis was performed. No intravenous contrast was administered. COMPARISON:  Obstetric ultrasound same date. FINDINGS: COMBINED FINDINGS FOR BOTH MR ABDOMEN AND PELVIS Lower chest:  The visualized lower chest appears unremarkable. Hepatobiliary: The liver is normal in signal, without focal abnormality. No evidence of gallstones, gallbladder wall  thickening or biliary dilatation. Pancreas: Unremarkable. No pancreatic ductal dilatation or surrounding inflammatory changes. Spleen: Normal in size without focal abnormality. Adrenals/Urinary Tract: Both adrenal glands appear normal. No evidence of hydronephrosis or focal perinephric fluid collection. The bladder appears unremarkable. Stomach/Bowel: The stomach appears unremarkable for its degree of distention. No small bowel distension or wall thickening identified. There are extensive inflammatory changes posterior to  the cecum which appear contiguous with an ill-defined enlarged appendix, measuring up to 1.4 cm in diameter on image 42/30. There is extensive surrounding ill-defined fluid, suspicious for early rupture. No well-defined abscess identified. The colon appears unremarkable. Vascular/Lymphatic: There are no enlarged abdominal or pelvic lymph nodes. No significant vascular findings on noncontrast imaging. Reproductive: No adnexal mass or uterine abnormality identified. Other: Moderate amount of free pelvic fluid. Musculoskeletal: No acute or significant osseous findings. IMPRESSION: 1. Right lower quadrant inflammatory process most consistent with ruptured acute retrocecal appendicitis. There is ill-defined fluid posterior to the cecum without well-defined abscess. Moderate amount of free pelvic fluid. 2. No evidence of bowel obstruction. 3. The uterus and adnexa appear unremarkable.  No hydronephrosis. 4. These results were called by telephone at the time of interpretation on 07/01/2021 at 5:33 pm to provider Chandra Batch, who verbally acknowledged these results. Electronically Signed   By: Carey Bullocks M.D.   On: 07/01/2021 17:35   US OB Comp < 14 Wks  Result Date: 07/01/2021 CLINICAL DATA:  RIGHT lower quadrant pain. Patient is a 25 year old female two months postpartum with elevated beta HCG and unknown last menstrual cycle. Latest beta HCG of 413 EXAM: OBSTETRIC <14 WK [place] AND  TRANSVAGINAL OB US TECHNIQUE: Both transabdominal and transvaginal ultrasound examinations were performed for complete evaluation of the gestation as well as the maternal uterus, adnexal regions, and pelvic cul-de-sac. Transvaginal technique was performed to assess early pregnancy. COMPARISON:  Comparison made with February of 2022. FINDINGS: Intrauterine gestational sac: None Yolk sac:  Not visualized Embryo:  Not visualized Maternal uterus/adnexae: Uterus measures 10.1 x 4.0 x 6.0 cm for a volume of 124 cc. Endometrium 7.2 mm RIGHT ovary with normal sonographic appearance. Small corpus luteum in the LEFT ovary. Small volume free fluid in the pelvis. IMPRESSION: No signs of intrauterine pregnancy at this time. Pregnancy of unknown anatomic location (no intrauterine gestational sac or adnexal mass identified). Differential diagnosis includes recent spontaneous miscarriage, IUP too early to visualize, and non-visualized ectopic pregnancy. Recommend close follow-up, correlation with serial beta-hCG levels, and follow up US if warranted clinically. Electronically Signed   By: Donzetta Kohut M.D.   On: 07/01/2021 13:41   US OB Transvaginal  Result Date: 07/01/2021 CLINICAL DATA:  RIGHT lower quadrant pain. Patient is a 25 year old female two months postpartum with elevated beta HCG and unknown last menstrual cycle. Latest beta HCG of 413 EXAM: OBSTETRIC <14 WK [place] AND TRANSVAGINAL OB US TECHNIQUE: Both transabdominal and transvaginal ultrasound examinations were performed for complete evaluation of the gestation as well as the maternal uterus, adnexal regions, and pelvic cul-de-sac. Transvaginal technique was performed to assess early pregnancy. COMPARISON:  Comparison made with February of 2022. FINDINGS: Intrauterine gestational sac: None Yolk sac:  Not visualized Embryo:  Not visualized Maternal uterus/adnexae: Uterus measures 10.1 x 4.0 x 6.0 cm for a volume of 124 cc. Endometrium 7.2 mm RIGHT ovary with normal  sonographic appearance. Small corpus luteum in the LEFT ovary. Small volume free fluid in the pelvis. IMPRESSION: No signs of intrauterine pregnancy at this time. Pregnancy of unknown anatomic location (no intrauterine gestational sac or adnexal mass identified). Differential diagnosis includes recent spontaneous miscarriage, IUP too early to visualize, and non-visualized ectopic pregnancy. Recommend close follow-up, correlation with serial beta-hCG levels, and follow up US if warranted clinically. Electronically Signed   By: Donzetta Kohut M.D.   On: 07/01/2021 13:41    ROS 10 point review of systems is negative except as listed above  in HPI.   Physical Exam Blood pressure 105/71, pulse (!) 108, temperature 98.8 F (37.1 C), temperature source Oral, resp. rate 18, last menstrual period 07/01/2020, SpO2 94 %, unknown if currently breastfeeding. Constitutional: well-developed, well-nourished HEENT: pupils equal, round, reactive to light, 47mm b/l, moist conjunctiva, external inspection of ears and nose normal, hearing intact Oropharynx: normal oropharyngeal mucosa, normal dentition Neck: no thyromegaly, trachea midline, no midline cervical tenderness to palpation Chest: breath sounds equal bilaterally, normal respiratory effort, no midline or lateral chest wall tenderness to palpation/deformity Abdomen: soft, RLQ TTP, no bruising, no hepatosplenomegaly GU: normal female genitalia  Back: no wounds, no thoracic/lumbar spine tenderness to palpation, no thoracic/lumbar spine stepoffs Rectal: deferred Extremities: 2+ radial and pedal pulses bilaterally, intact motor and sensation bilateral UE and LE, no peripheral edema MSK: normal gait/station, no clubbing/cyanosis of fingers/toes, normal ROM of all four extremities Skin: warm, dry, no rashes Psych: normal memory, normal mood/affect    Assessment/Plan: 33F with ruptured appendicitis. Elevated beta hCG of 463 with LMP 05/2021. Discussion held with  the patient and her husband via phone regarding the potential for pregnancy (recent delivery 04/17/2021), risk of pregnancy loss if present in the setting of ruptured appendicitis, as well as risks and benefits of surgical and non-surgical management. It was clearly explained that the risks of surgery include bleeding, infection, abscess, staple line leak, stump appendicitis, non-visualization of the appendix, injury to surrounding structures, need for partial colon resection, and need for conversion to open procedure. It was clearly explained that non-operative management carries with it the risk of abscess formation requiring drain placement and failure of non-operative management in which appendectomy is indicated. All questions answered to the patient and her husband's satisfaction. Patient desires to proceed with surgery. Entire discussion held using a [place]a interpreter via phone, Jenna Miranda, ID# [id number].    Diamantina Monks, MD General and Trauma Surgery [facility]

## 2021-07-01 NOTE — ED Triage Notes (Signed)
Patient reports RLQ abdominal pain with emesis onset 2 days ago , no diarrhea .

## 2021-07-01 NOTE — Anesthesia Preprocedure Evaluation (Addendum)
Anesthesia Evaluation  Patient identified by MRN, date of birth, ID band Patient awake    Reviewed: Allergy & Precautions, NPO status , Patient's Chart, lab work & pertinent test results  Airway Mallampati: II  TM Distance: >3 FB Neck ROM: Full    Dental  (+) Teeth Intact   Pulmonary neg pulmonary ROS,    Pulmonary exam normal        Cardiovascular negative cardio ROS   Rhythm:Regular Rate:Normal     Neuro/Psych negative neurological ROS  negative psych ROS   GI/Hepatic Neg liver ROS, GERD  ,Acute appendicitis    Endo/Other  negative endocrine ROS  Renal/GU negative Renal ROS  negative genitourinary   Musculoskeletal negative musculoskeletal ROS (+)   Abdominal Normal abdominal exam  (+)   Peds  Hematology negative hematology ROS (+)   Anesthesia Other Findings   Reproductive/Obstetrics                           Anesthesia Physical Anesthesia Plan  ASA: 2 and emergent  Anesthesia Plan: General   Post-op Pain Management:    Induction: Intravenous  PONV Risk Score and Plan: 3 and Ondansetron, Dexamethasone, Midazolam and Treatment may vary due to age or medical condition  Airway Management Planned: Mask and Oral ETT  Additional Equipment: None  Intra-op Plan:   Post-operative Plan: Extubation in OR  Informed Consent: I have reviewed the patients History and Physical, chart, labs and discussed the procedure including the risks, benefits and alternatives for the proposed anesthesia with the patient or authorized representative who has indicated his/her understanding and acceptance.     Dental advisory given  Plan Discussed with: CRNA  Anesthesia Plan Comments:        Anesthesia Quick Evaluation

## 2021-07-01 NOTE — ED Provider Notes (Signed)
[facility] EMERGENCY DEPARTMENT Provider Note   CSN: [id number] Arrival date & time: 07/01/21  7829     History Chief Complaint  Patient presents with   Abdominal Pain    Jenna Miranda is a 25 y.o. female who presents to the ED today with complaint of gradual onset, constant, sharp, severe, RLQ abdominal pain x 2 days. Pt also complains of nausea, vomiting, and decreased appetite. She was unaware she had a fever - came in febrile at 100.2 tonight. She denies diarrhea or constipation. LNMP 3 weeks ago. She is sexually active. She reports her partner using condoms. She is not on birth control. She recently delivered a child on 08/08. No previous abdominal surgeries to abdomen. No other complaints at this time.   The history is provided by the patient and medical records.      Past Medical History:  Diagnosis Date   Medical history non-contributory     Patient Active Problem List   Diagnosis Date Noted   Sickle cell trait (HCC) 02/05/2021   Supervision of high risk pregnancy, antepartum 01/12/2021   Gastroesophageal reflux disease without esophagitis 07/31/2019    Past Surgical History:  Procedure Laterality Date   NO PAST SURGERIES       OB History     Gravida  4   Para  2   Term  2   Preterm      AB  1   Living  2      SAB  1   IAB      Ectopic      Multiple      Live Births  2           No family history on file.  Social History   Tobacco Use   Smoking status: Never   Smokeless tobacco: Never  Substance Use Topics   Alcohol use: Not Currently   Drug use: Not Currently    Home Medications Prior to Admission medications   Medication Sig Start Date End Date Taking? Authorizing Provider  acetaminophen (TYLENOL) 325 MG tablet Take 2 tablets (650 mg total) by mouth every 6 (six) hours as needed. 09/25/18   Aviva Signs, CNM  Doxylamine-Pyridoxine 10-10 MG TBEC Take 2 tablets by mouth at bedtime as needed. For  nausea Patient not taking: Reported on 01/16/2021 10/15/20   Cathren Laine, MD  ergocalciferol (VITAMIN D2) 1.25 MG (50000 UT) capsule Take 1 capsule by mouth once a week. 05/24/20   [provider]  famotidine (PEPCID) 20 MG tablet Take 1 tablet (20 mg total) by mouth 2 (two) times daily. Patient not taking: Reported on 01/16/2021 10/04/18   Marny Lowenstein, PA-C  methocarbamol (ROBAXIN) 500 MG tablet Take by mouth. Patient not taking: Reported on 01/16/2021 05/24/20   [provider]  naproxen (NAPROSYN) 500 MG tablet Take by mouth. Patient not taking: Reported on 01/16/2021 05/24/20   [provider]  nitrofurantoin, macrocrystal-monohydrate, (MACROBID) 100 MG capsule Take 1 capsule (100 mg total) by mouth 2 (two) times daily. 01/20/21   Burleson, Brand Males, NP  Prenatal Multivit-Min-Fe-FA (PRENATAL VITAMINS) 0.8 MG tablet Take 1 tablet by mouth daily. Patient not taking: Reported on 01/16/2021 09/25/18   Elson Areas, PA-C    Allergies    Patient has no known allergies.  Review of Systems   Review of Systems  Constitutional:  Positive for appetite change and fever. Negative for chills.  Gastrointestinal:  Positive for abdominal pain, nausea and  vomiting. Negative for constipation and diarrhea.  All other systems reviewed and are negative.  Physical Exam Updated Vital Signs BP 129/76 (BP Location: Left Arm)   Pulse (!) 105   Temp 100.2 F (37.9 C) (Oral)   Resp 18   LMP 07/01/2020   SpO2 100%   Physical Exam Vitals and nursing note reviewed.  Constitutional:      Appearance: She is not ill-appearing or diaphoretic.  HENT:     Head: Normocephalic and atraumatic.  Eyes:     Conjunctiva/sclera: Conjunctivae normal.  Cardiovascular:     Rate and Rhythm: Normal rate and regular rhythm.     Heart sounds: Normal heart sounds.  Pulmonary:     Effort: Pulmonary effort is normal.     Breath sounds: Normal breath sounds. No wheezing, rhonchi or rales.  Abdominal:      Palpations: Abdomen is soft.     Tenderness: There is abdominal tenderness in the right upper quadrant, right lower quadrant and periumbilical area. There is no guarding or rebound.  Musculoskeletal:     Cervical back: Neck supple.  Skin:    General: Skin is warm and dry.  Neurological:     Mental Status: She is alert.    ED Results / Procedures / Treatments   Labs (all labs ordered are listed, but only abnormal results are displayed) Labs Reviewed  COMPREHENSIVE METABOLIC PANEL - Abnormal; Notable for the following components:      Result Value   Sodium 133 (*)    CO2 21 (*)    Glucose, Bld 129 (*)    BUN <5 (*)    Creatinine, Ser 0.42 (*)    All other components within normal limits  CBC - Abnormal; Notable for the following components:   WBC 13.4 (*)    RBC 5.36 (*)    MCV 76.7 (*)    MCH 25.9 (*)    RDW 20.6 (*)    All other components within normal limits  URINALYSIS, ROUTINE W REFLEX MICROSCOPIC - Abnormal; Notable for the following components:   APPearance HAZY (*)    Ketones, ur 5 (*)    All other components within normal limits  HCG, QUANTITATIVE, PREGNANCY - Abnormal; Notable for the following components:   hCG, Beta Chain, Quant, S 463 (*)    All other components within normal limits  CULTURE, BLOOD (ROUTINE X 2)  CULTURE, BLOOD (ROUTINE X 2)  RESP PANEL BY RT-PCR (FLU A&B, COVID) ARPGX2  LIPASE, BLOOD  LACTIC ACID, PLASMA  I-STAT BETA HCG BLOOD, ED ([facility], [facility], [facility] ONLY)    EKG None  Radiology US OB Comp < 14 Wks  Result Date: 07/01/2021 CLINICAL DATA:  RIGHT lower quadrant pain. Patient is a 25 year old female two months postpartum with elevated beta HCG and unknown last menstrual cycle. Latest beta HCG of 413 EXAM: OBSTETRIC <14 WK [place] AND TRANSVAGINAL OB US TECHNIQUE: Both transabdominal and transvaginal ultrasound examinations were performed for complete evaluation of the gestation as well as the maternal uterus, adnexal regions, and pelvic cul-de-sac.  Transvaginal technique was performed to assess early pregnancy. COMPARISON:  Comparison made with February of 2022. FINDINGS: Intrauterine gestational sac: None Yolk sac:  Not visualized Embryo:  Not visualized Maternal uterus/adnexae: Uterus measures 10.1 x 4.0 x 6.0 cm for a volume of 124 cc. Endometrium 7.2 mm RIGHT ovary with normal sonographic appearance. Small corpus luteum in the LEFT ovary. Small volume free fluid in the pelvis. IMPRESSION: No signs of intrauterine pregnancy at this  time. Pregnancy of unknown anatomic location (no intrauterine gestational sac or adnexal mass identified). Differential diagnosis includes recent spontaneous miscarriage, IUP too early to visualize, and non-visualized ectopic pregnancy. Recommend close follow-up, correlation with serial beta-hCG levels, and follow up US if warranted clinically. Electronically Signed   By: Donzetta Kohut M.D.   On: 07/01/2021 13:41   US OB Transvaginal  Result Date: 07/01/2021 CLINICAL DATA:  RIGHT lower quadrant pain. Patient is a 25 year old female two months postpartum with elevated beta HCG and unknown last menstrual cycle. Latest beta HCG of 413 EXAM: OBSTETRIC <14 WK [place] AND TRANSVAGINAL OB US TECHNIQUE: Both transabdominal and transvaginal ultrasound examinations were performed for complete evaluation of the gestation as well as the maternal uterus, adnexal regions, and pelvic cul-de-sac. Transvaginal technique was performed to assess early pregnancy. COMPARISON:  Comparison made with February of 2022. FINDINGS: Intrauterine gestational sac: None Yolk sac:  Not visualized Embryo:  Not visualized Maternal uterus/adnexae: Uterus measures 10.1 x 4.0 x 6.0 cm for a volume of 124 cc. Endometrium 7.2 mm RIGHT ovary with normal sonographic appearance. Small corpus luteum in the LEFT ovary. Small volume free fluid in the pelvis. IMPRESSION: No signs of intrauterine pregnancy at this time. Pregnancy of unknown anatomic location (no intrauterine  gestational sac or adnexal mass identified). Differential diagnosis includes recent spontaneous miscarriage, IUP too early to visualize, and non-visualized ectopic pregnancy. Recommend close follow-up, correlation with serial beta-hCG levels, and follow up US if warranted clinically. Electronically Signed   By: Donzetta Kohut M.D.   On: 07/01/2021 13:41    Procedures Procedures   Medications Ordered in ED Medications  sodium chloride 0.9 % bolus 1,000 mL (0 mLs Intravenous Stopped 07/01/21 0939)  ondansetron (ZOFRAN) injection 4 mg (4 mg Intravenous Given 07/01/21 0806)  acetaminophen (TYLENOL) tablet 650 mg (650 mg Oral Given 07/01/21 0806)  HYDROmorphone (DILAUDID) injection 1 mg (1 mg Intravenous Given 07/01/21 1410)    ED Course  I have reviewed the triage vital signs and the nursing notes.  Pertinent labs & imaging results that were available during my care of the patient were reviewed by me and considered in my medical decision making (see chart for details).    MDM Rules/Calculators/A&P                           25 year old female who presents to the ED today with complaint of right lower quadrant abdominal pain, nausea, vomiting, anorexia for the past 2 days.  Found to be febrile in the ED at 100.2 and tachycardic at 108.  She had lab work done prior to being seen including a CBC, CMP, lipase.  CBC has returned with an elevated white blood cell count of 13,400.  CMP with glucose of 129 and bicarb 21.  No gap.  No history of diabetes.  Sodium slightly decreased at 133.  No other electrolyte abnormalities.  LFTs unremarkable.  Lipase of 27.  Urinalysis without signs of infection.  On my exam she has obvious right lower quadrant abdominal tenderness palpation with periumbilical and right upper quadrant tenderness palpation.  No previous abdominal surgeries.  Concern for possible appendicitis at this time.  Also question possible cholecystitis.  We will plan for CT pelvis at this time for  further evaluation.  We will plan for fluids, antiemetics, pain medication.  Will obtain lactic acid given fever and blood cultures.   Beta hcg collected however had no resulted as the lab is having  technical difficulties with hcg crossing over. It has returned elevated at 413.1. Quant hcg collected at this time. Pt did recently give birth in August however question elevation s/2 recent pregnancy vs new pregnancy. CT held at this time. Morphine not provided for pain until we know if pt is truly pregnant.   Hcg quant 463.  Pelvic ultrasound obtained - no IUP visualized.  Discussed case with OBGYN Dr. Charlotta Newton who would not suspect an elevated hcg on this level given pt gave birth > 2 months ago. Concern for early pregnancy. Recommends repeat hcg level in 2 days at MAU. Recommends MRI abdomen if truly concerned about appendicitis. Pt is able to receive stronger pain medication given possible very early pregnancy. Will order additional pain medication at this time. If MRI negative and pt stable with improvement in symptoms she can be discharged home with repeat hcg in 2 days time.   At shift change case signed out to [facility] PA-C who will follow up on MRI and dispo accordingly.   Final Clinical Impression(s) / ED Diagnoses Final diagnoses:  Right lower quadrant abdominal pain  Positive pregnancy test  Fever, unspecified fever cause    Rx / DC Orders ED Discharge Orders     None        Tanda Rockers, PA-C 07/01/21 1611    Rolan Bucco, MD 07/02/21 [phone]

## 2021-07-01 NOTE — ED Provider Notes (Signed)
  Care of patient assumed from PA [place] at [phone].  Agree with history, physical exam and plan.  See their note for further details. Briefly, 25 year old female presents with chief complaint of right lower quadrant pain x2 days.  Patient also endorses nausea, vomiting, and decreased appetite.  LMP 9/19.  G3 P3   Physical Exam  BP 119/75 (BP Location: Right Arm)   Pulse (!) 102   Temp 98.8 F (37.1 C) (Oral)   Resp 18   LMP 07/01/2020   SpO2 98%   Physical Exam Vitals and nursing note reviewed.  Constitutional:      General: She is not in acute distress.    Appearance: She is not ill-appearing, toxic-appearing or diaphoretic.  HENT:     Head: Normocephalic.  Eyes:     General: No scleral icterus.       Right eye: No discharge.        Left eye: No discharge.  Cardiovascular:     Rate and Rhythm: Normal rate.  Pulmonary:     Effort: Pulmonary effort is normal.  Abdominal:     General: Abdomen is flat. There is no distension. There are no signs of injury.     Palpations: Abdomen is soft.     Tenderness: There is abdominal tenderness in the right upper quadrant and right lower quadrant. There is guarding. There is no rebound.     Hernia: There is no hernia in the umbilical area or ventral area.  Skin:    General: Skin is warm and dry.  Neurological:     General: No focal deficit present.     Mental Status: She is alert.  Psychiatric:        Behavior: Behavior is cooperative.    ED Course/Procedures   Clinical Course as of 07/01/21 2039  Sat Jul 01, 2021  1902 Spoke to surgeon Dr. Bedelia Person who will review imaging and see the patient for consult. [PB]    Clinical Course User Index [PB] Haskel Schroeder, PA-C    .Critical Care Performed by: Haskel Schroeder, PA-C Authorized by: Haskel Schroeder, PA-C   Critical care provider statement:    Critical care time (minutes):  30   Critical care time was exclusive of:  Separately billable procedures and treating  other patients   Critical care was necessary to treat or prevent imminent or life-threatening deterioration of the following conditions: Rupture appendicitis.   Critical care was time spent personally by me on the following activities:  Development of treatment plan with patient or surrogate, discussions with consultants, evaluation of patient's response to treatment, examination of patient, obtaining history from patient or surrogate, ordering and performing treatments and interventions, ordering and review of laboratory studies, ordering and review of radiographic studies, pulse oximetry and review of old charts  MDM   Patient was febrile on arrival.  Due to right lower quadrant pain concern for possible appendicitis.  Patient was found to be pregnant.  OB/GYN was contacted by previous provider, who recommended repeat beta-hCG level in 2 days.  Recommended MRI to evaluate for concern of appendicitis.  Patient is G4 P3, last menstrual period 9/19.  1733 contacted by radiologist who reports that MRI is concerning for ruptured appendicitis.  Will start patient on Zosyn and consult general surgery.  Patient will be brought to operating room by general surgery.      Berneice Heinrich 07/01/21 2039    Terrilee Files, MD 07/02/21 1125

## 2021-07-01 NOTE — ED Notes (Signed)
Surgeon at the bedside.

## 2021-07-01 NOTE — Progress Notes (Signed)
   OB/GYN Telephone Consult  07/01/21  Jenna Miranda is a 25 y.o. [id number] s/p recent delivery on 04/17/2021.  She presented to [facility].    The provider had a clinical question about potential pregnancy/work up  The provider presented the following relevant clinical information: Pt presented due to RLQ pain and vomiting for the past 2 days.  As part of the work up, pregnancy test was positive.  HCG was completed: 463 [place] completed: IMPRESSION: No signs of intrauterine pregnancy at this time. Pregnancy of unknown anatomic location (no intrauterine gestational sac or adnexal mass identified). Differential diagnosis includes recent spontaneous miscarriage, IUP too early to visualize, and non-visualized ectopic pregnancy. Recommend close follow-up, correlation with serial beta-hCG levels, and follow up US if warranted clinically.  I performed a chart review on the patient and reviewed available documentation.  BP (!) 111/94 (BP Location: Right Arm)   Pulse 99   Temp 98.8 F (37.1 C) (Oral)   Resp 18   LMP 07/01/2020   SpO2 100%   Exam- performed by consulting provider   Recommendations:  -Though she was recently pregnant, would suspect that the hormone level would have returned to baseline.  There is concern that this may be a new pregnancy. -If patient appears stable, would advise repeat hcg in 48hr -In terms of treatment in the ER for further evaluation- ok for MRI, ok for pain medication as needed  -Recommended MD/APP provide the patient with a follow up in our MAU for a repeat hCG/further evaluation   Thank you for this consult and if additional recommendations are needed please call [phone] for the OB/GYN attending on service at [facility].   I spent approximately 5 minutes directly consulting with the provider and verbally discussing this case. Additionally 15 minutes minutes was spent performing chart review and documentation.    Myna Hidalgo, DO Attending Obstetrician & Gynecologist, [facility] for [facility], [facility]     Criteria for phone consult billing? (If answer to any of these are yes then you cannot bill this telephone consult) Will the patient be seen urgently (within 24hrs) at a [facility] practice? Yes Is this a patient on which I performed surgery within the last 7d? No Have you billed a telephone consult on this patient in the last 7d? No

## 2021-07-01 NOTE — ED Notes (Signed)
Consult provider at pt bedside. Will transfer pt to Procedural Short Stay upon consultation completion.

## 2021-07-01 NOTE — Discharge Instructions (Addendum)
Go directly to the MAU in 2 days time for repeat pregnancy test (hcg level) - on Monday 10/24  Do NOT take ibuprofen.  [facility] ______[facility] LAPAROSCOPIC SURGERY: POST OP INSTRUCTIONS Always review your discharge instruction sheet given to you by the facility where your surgery was performed. IF YOU HAVE DISABILITY OR FAMILY LEAVE FORMS, YOU MUST BRING THEM TO THE OFFICE FOR PROCESSING.   DO NOT GIVE THEM TO YOUR DOCTOR.  A prescription for pain medication may be given to you upon discharge.  Take your pain medication as prescribed, if needed.  If narcotic pain medicine is not needed, then you may take acetaminophen (Tylenol) or ibuprofen (Advil) as needed. Take your usually prescribed medications unless otherwise directed. If you need a refill on your pain medication, please contact your pharmacy.  They will contact our office to request authorization. Prescriptions will not be filled after 5pm or on week-ends. You should follow a light diet the first few days after arrival home, such as soup and crackers, etc.  Be sure to include lots of fluids daily. Most patients will experience some swelling and bruising in the area of the incisions.  Ice packs will help.  Swelling and bruising can take several days to resolve.  It is common to experience some constipation if taking pain medication after surgery.  Increasing fluid intake and taking a stool softener (such as Colace) will usually help or prevent this problem from occurring.  A mild laxative (Milk of Magnesia or Miralax) should be taken according to package instructions if there are no bowel movements after 48 hours. Unless discharge instructions indicate otherwise, you may remove your bandages 24-48 hours after surgery, and you may shower at that time.  You may have steri-strips (small skin tapes) in place directly over the incision.  These strips should be left on the skin for 7-10 days.  If your surgeon used skin glue on the  incision, you may shower in 24 hours.  The glue will flake off over the next 2-3 weeks.  Any sutures or staples will be removed at the office during your follow-up visit. ACTIVITIES:  You may resume regular (light) daily activities beginning the next day--such as daily self-care, walking, climbing stairs--gradually increasing activities as tolerated.  You may have sexual intercourse when it is comfortable.  Refrain from any heavy lifting or straining until approved by your doctor. You may drive when you are no longer taking prescription pain medication, you can comfortably wear a seatbelt, and you can safely maneuver your car and apply brakes. RETURN TO WORK:  __________________________________________________________ Jenna Miranda should see your doctor in the office for a follow-up appointment approximately 2-3 weeks after your surgery.  Make sure that you call for this appointment within a day or two after you arrive home to insure a convenient appointment time. OTHER INSTRUCTIONS: __________________________________________________________________________________________________________________________ __________________________________________________________________________________________________________________________ WHEN TO CALL YOUR DOCTOR: Fever over 101.0 Inability to urinate Continued bleeding from incision. Increased pain, redness, or drainage from the incision. Increasing abdominal pain  The clinic staff is available to answer your questions during regular business hours.  Please don't hesitate to call and ask to speak to one of the nurses for clinical concerns.  If you have a medical emergency, go to the nearest emergency room or call 911.  A surgeon from [facility] is always on call at the hospital. [address], [address], [place], [place]  [address] ? [address] [address], [place], [place]   [address] [phone] ?  [phone] ? FAX [phone] Web site:  [email or web address]  Is it safe to breastfeed while I'm  using opioid pain medication?  Yes. When these medications are taken as prescribed,  the risk to your baby is small. Opioid pain  medications have been shown to be safe when:  You take only the amount as needed to control  your pain  You take them only for as long as prescribed. This is  usually a short time--4 to 6 days or fewer. Don't let concerns about your opioid pain medication  stop you from breastfeeding. The benefits of  breastfeeding are greater than the risks of these  medications. If you don't keep breastfeeding, you risk  losing your milk supply.  You do, however, need to take steps to limit the  amount of pain medication that gets to your baby.   Breastfeeding Schedule Breastfeed every 2 to 3 hours.  Opioid Medication Take opioid medication for breakthrough pain as needed right before breastfeeding. When possible, avoid breastfeeding between 1 and 2  hours after taking the opioid. This is when it is at its highest level in  your body. It is important to take the opioid medication right before  breastfeeding so that your baby is exposed to as little of the opioid  medication as possible in your milk. Your doctor may prescribe opioid medication to take on a schedule of  every 4 to 6 hours, but only take as needed.   What should I watch out for? If your baby shows any of the symptoms below, call  your doctor. If you can't reach your doctor, go to the  emergency room.   Your baby is much sleepier than normal or is  difficult to wake for feedings.  Your baby's breastfeeding patterns change or your  baby can't suck as well as usual.  Your baby is constipated

## 2021-07-02 ENCOUNTER — Encounter (HOSPITAL_COMMUNITY): Payer: Self-pay | Admitting: Surgery

## 2021-07-02 LAB — CBC
HCT: 38.6 % (ref 36.0–46.0)
Hemoglobin: 13.1 g/dL (ref 12.0–15.0)
MCH: 26 pg (ref 26.0–34.0)
MCHC: 33.9 g/dL (ref 30.0–36.0)
MCV: 76.6 fL — ABNORMAL LOW (ref 80.0–100.0)
Platelets: 203 10*3/uL (ref 150–400)
RBC: 5.04 MIL/uL (ref 3.87–5.11)
RDW: 20.2 % — ABNORMAL HIGH (ref 11.5–15.5)
WBC: 13.4 10*3/uL — ABNORMAL HIGH (ref 4.0–10.5)
nRBC: 0 % (ref 0.0–0.2)

## 2021-07-02 LAB — I-STAT BETA HCG BLOOD, ED (NOT ORDERABLE)
I-stat hCG, quantitative: 446.9 m[IU]/mL — ABNORMAL HIGH (ref <5)
I-stat hCG, quantitative: 413.1 m[IU]/mL — ABNORMAL HIGH (ref <5)
I-stat hCG, quantitative: 386.3 m[IU]/mL — ABNORMAL HIGH (ref <5)

## 2021-07-02 LAB — BASIC METABOLIC PANEL
Anion gap: 9 (ref 5–15)
BUN: <5 mg/dL — ABNORMAL LOW (ref 6–20)
CO2: 22 mmol/L (ref 22–32)
Calcium: 8.6 mg/dL — ABNORMAL LOW (ref 8.9–10.3)
Chloride: 104 mmol/L (ref 98–111)
Creatinine, Ser: 0.53 mg/dL (ref 0.44–1.00)
GFR, Estimated: >60 mL/min (ref >60)
Glucose, Bld: 168 mg/dL — ABNORMAL HIGH (ref 70–99)
Potassium: 3.7 mmol/L (ref 3.5–5.1)
Sodium: 135 mmol/L (ref 135–145)

## 2021-07-02 MED ORDER — OXYCODONE HCL 5 MG PO TABS: 5.0000 mg | ORAL_TABLET | Freq: Three times a day (TID) | ORAL | 0 refills | Status: AC

## 2021-07-02 MED ORDER — AMOXICILLIN-POT CLAVULANATE 875-125 MG PO TABS: 1.0000 | ORAL_TABLET | Freq: Two times a day (BID) | ORAL | 0 refills | Status: AC

## 2021-07-02 NOTE — Discharge Summary (Signed)
[facility] Discharge Summary   Patient ID: Jenna Miranda MRN: [id number] DOB/AGE: 02/25/1996 25 y.o.  Admit date: 07/01/2021 Discharge date: 07/02/2021  Admitting Diagnosis: Positive pregnancy test [Z32.01] Right lower quadrant abdominal pain [R10.31] Ruptured appendicitis [K35.32]   Discharge Diagnosis Positive pregnancy test [Z32.01] Ruptured appendicitis [K35.32]  Consultants OBGYN - Dr. Charlotta Newton  Imaging: MR PELVIS WO CONTRAST  Result Date: 07/01/2021 CLINICAL DATA:  Right lower quadrant abdominal pain with leukocytosis and fever. Positive pregnancy test. Clinical concern for appendicitis. EXAM: MRI ABDOMEN AND PELVIS WITHOUT CONTRAST TECHNIQUE: Multiplanar multisequence MR imaging of the abdomen and pelvis was performed. No intravenous contrast was administered. COMPARISON:  Obstetric ultrasound same date. FINDINGS: COMBINED FINDINGS FOR BOTH MR ABDOMEN AND PELVIS Lower chest:  The visualized lower chest appears unremarkable. Hepatobiliary: The liver is normal in signal, without focal abnormality. No evidence of gallstones, gallbladder wall thickening or biliary dilatation. Pancreas: Unremarkable. No pancreatic ductal dilatation or surrounding inflammatory changes. Spleen: Normal in size without focal abnormality. Adrenals/Urinary Tract: Both adrenal glands appear normal. No evidence of hydronephrosis or focal perinephric fluid collection. The bladder appears unremarkable. Stomach/Bowel: The stomach appears unremarkable for its degree of distention. No small bowel distension or wall thickening identified. There are extensive inflammatory changes posterior to the cecum which appear contiguous with an ill-defined enlarged appendix, measuring up to 1.4 cm in diameter on image 42/30. There is extensive surrounding ill-defined fluid, suspicious for early rupture. No well-defined abscess identified. The colon appears unremarkable. Vascular/Lymphatic: There are no enlarged  abdominal or pelvic lymph nodes. No significant vascular findings on noncontrast imaging. Reproductive: No adnexal mass or uterine abnormality identified. Other: Moderate amount of free pelvic fluid. Musculoskeletal: No acute or significant osseous findings. IMPRESSION: 1. Right lower quadrant inflammatory process most consistent with ruptured acute retrocecal appendicitis. There is ill-defined fluid posterior to the cecum without well-defined abscess. Moderate amount of free pelvic fluid. 2. No evidence of bowel obstruction. 3. The uterus and adnexa appear unremarkable.  No hydronephrosis. 4. These results were called by telephone at the time of interpretation on 07/01/2021 at 5:33 pm to provider Chandra Batch, who verbally acknowledged these results. Electronically Signed   By: Carey Bullocks M.D.   On: 07/01/2021 17:35   MR ABDOMEN WO CONTRAST  Result Date: 07/01/2021 CLINICAL DATA:  Right lower quadrant abdominal pain with leukocytosis and fever. Positive pregnancy test. Clinical concern for appendicitis. EXAM: MRI ABDOMEN AND PELVIS WITHOUT CONTRAST TECHNIQUE: Multiplanar multisequence MR imaging of the abdomen and pelvis was performed. No intravenous contrast was administered. COMPARISON:  Obstetric ultrasound same date. FINDINGS: COMBINED FINDINGS FOR BOTH MR ABDOMEN AND PELVIS Lower chest:  The visualized lower chest appears unremarkable. Hepatobiliary: The liver is normal in signal, without focal abnormality. No evidence of gallstones, gallbladder wall thickening or biliary dilatation. Pancreas: Unremarkable. No pancreatic ductal dilatation or surrounding inflammatory changes. Spleen: Normal in size without focal abnormality. Adrenals/Urinary Tract: Both adrenal glands appear normal. No evidence of hydronephrosis or focal perinephric fluid collection. The bladder appears unremarkable. Stomach/Bowel: The stomach appears unremarkable for its degree of distention. No small bowel distension or wall  thickening identified. There are extensive inflammatory changes posterior to the cecum which appear contiguous with an ill-defined enlarged appendix, measuring up to 1.4 cm in diameter on image 42/30. There is extensive surrounding ill-defined fluid, suspicious for early rupture. No well-defined abscess identified. The colon appears unremarkable. Vascular/Lymphatic: There are no enlarged abdominal or pelvic lymph nodes. No significant vascular findings on noncontrast imaging. Reproductive: No adnexal  mass or uterine abnormality identified. Other: Moderate amount of free pelvic fluid. Musculoskeletal: No acute or significant osseous findings. IMPRESSION: 1. Right lower quadrant inflammatory process most consistent with ruptured acute retrocecal appendicitis. There is ill-defined fluid posterior to the cecum without well-defined abscess. Moderate amount of free pelvic fluid. 2. No evidence of bowel obstruction. 3. The uterus and adnexa appear unremarkable.  No hydronephrosis. 4. These results were called by telephone at the time of interpretation on 07/01/2021 at 5:33 pm to provider Chandra Batch, who verbally acknowledged these results. Electronically Signed   By: Carey Bullocks M.D.   On: 07/01/2021 17:35   US OB Comp < 14 Wks  Result Date: 07/01/2021 CLINICAL DATA:  RIGHT lower quadrant pain. Patient is a 25 year old female two months postpartum with elevated beta HCG and unknown last menstrual cycle. Latest beta HCG of 413 EXAM: OBSTETRIC <14 WK [place] AND TRANSVAGINAL OB US TECHNIQUE: Both transabdominal and transvaginal ultrasound examinations were performed for complete evaluation of the gestation as well as the maternal uterus, adnexal regions, and pelvic cul-de-sac. Transvaginal technique was performed to assess early pregnancy. COMPARISON:  Comparison made with February of 2022. FINDINGS: Intrauterine gestational sac: None Yolk sac:  Not visualized Embryo:  Not visualized Maternal uterus/adnexae:  Uterus measures 10.1 x 4.0 x 6.0 cm for a volume of 124 cc. Endometrium 7.2 mm RIGHT ovary with normal sonographic appearance. Small corpus luteum in the LEFT ovary. Small volume free fluid in the pelvis. IMPRESSION: No signs of intrauterine pregnancy at this time. Pregnancy of unknown anatomic location (no intrauterine gestational sac or adnexal mass identified). Differential diagnosis includes recent spontaneous miscarriage, IUP too early to visualize, and non-visualized ectopic pregnancy. Recommend close follow-up, correlation with serial beta-hCG levels, and follow up US if warranted clinically. Electronically Signed   By: Donzetta Kohut M.D.   On: 07/01/2021 13:41   US OB Transvaginal  Result Date: 07/01/2021 CLINICAL DATA:  RIGHT lower quadrant pain. Patient is a 25 year old female two months postpartum with elevated beta HCG and unknown last menstrual cycle. Latest beta HCG of 413 EXAM: OBSTETRIC <14 WK [place] AND TRANSVAGINAL OB US TECHNIQUE: Both transabdominal and transvaginal ultrasound examinations were performed for complete evaluation of the gestation as well as the maternal uterus, adnexal regions, and pelvic cul-de-sac. Transvaginal technique was performed to assess early pregnancy. COMPARISON:  Comparison made with February of 2022. FINDINGS: Intrauterine gestational sac: None Yolk sac:  Not visualized Embryo:  Not visualized Maternal uterus/adnexae: Uterus measures 10.1 x 4.0 x 6.0 cm for a volume of 124 cc. Endometrium 7.2 mm RIGHT ovary with normal sonographic appearance. Small corpus luteum in the LEFT ovary. Small volume free fluid in the pelvis. IMPRESSION: No signs of intrauterine pregnancy at this time. Pregnancy of unknown anatomic location (no intrauterine gestational sac or adnexal mass identified). Differential diagnosis includes recent spontaneous miscarriage, IUP too early to visualize, and non-visualized ectopic pregnancy. Recommend close follow-up, correlation with serial beta-hCG  levels, and follow up US if warranted clinically. Electronically Signed   By: Donzetta Kohut M.D.   On: 07/01/2021 13:41    Procedures Dr. Bedelia Person (07/01/21) - Laparoscopic Appendectomy  Hospital Course:  25 year old female who [facility] ED with right lower quadrant pain.  Workup showed acute perforated appendicitis and elevated Hcg consistent with early pregnancy.  Patient was admitted and underwent procedure listed above.  Tolerated procedure well and was transferred to the floor.  OBGYN was consulted in regard to concern for early pregnancy (patient recently  delivered on 04/17/21) who recommended follow up with OBGYN and repeat Hcg in 48 hours. Postoperatively diet was advanced as tolerated.  On POD1, the patient was voiding well, tolerating diet, ambulating well, pain well controlled, vital signs stable, incisions c/d/i and felt stable for discharge home.  Patient will follow up in our office in 3 weeks and knows to call with questions or concerns.  She will call to confirm appointment date/time.   She will follow up with OBGYN on Monday for repeat Hcg. She was prescribed augmentin at discharge and oxycodone 5mg  #9/0 to alternate with tylenol for pain control. She was instructed to not take NSAIDs. She was instructed on how to safely take opioid pain medication while breastfeeding.  Physical Exam: General:  Alert, NAD, pleasant, comfortable Abd:  Soft, ND, mild tenderness, incisions C/D/I  I or a member of my team have reviewed this patient in the Controlled Substance Database.   Entire visit conducted using a [place] interpreter via phone   Allergies as of 07/02/2021   No Known Allergies      Medication List     STOP taking these medications    methocarbamol 500 MG tablet Commonly known as: ROBAXIN   naproxen 500 MG tablet Commonly known as: NAPROSYN   nitrofurantoin (macrocrystal-monohydrate) 100 MG capsule Commonly known as: MACROBID       TAKE these medications     acetaminophen 325 MG tablet Commonly known as: Tylenol Take 2 tablets (650 mg total) by mouth every 6 (six) hours as needed.   amoxicillin-clavulanate 875-125 MG tablet Commonly known as: Augmentin Take 1 tablet by mouth 2 (two) times daily for 4 days.   Doxylamine-Pyridoxine 10-10 MG Tbec Take 2 tablets by mouth at bedtime as needed. For nausea   ergocalciferol 1.25 MG (50000 UT) capsule Commonly known as: VITAMIN D2 Take 1 capsule by mouth once a week.   famotidine 20 MG tablet Commonly known as: PEPCID Take 1 tablet (20 mg total) by mouth 2 (two) times daily.   oxyCODONE 5 MG immediate release tablet Commonly known as: Roxicodone Take 1 tablet (5 mg total) by mouth every 8 (eight) hours for 3 days.   Prenatal Vitamins 0.8 MG tablet Take 1 tablet by mouth daily.          Follow-up Information     [facility]. Call.   Specialty: Obstetrics and Gynecology Why: Call as soon as possible on Monday morning for appointment on Monday to recheck pregnancy test Contact information: [address] [place] [phone]        Sur[facility], [place]. Call.   Specialty: General Surgery Why: We are working to make this appointment for you. please call to confirm appointment date and time Contact information: [address] [place] [phone]                 Signed: [phone] [facility] 07/02/2021, 11:30 AM Please see Amion for pager number during day hours 7:00am-4:30pm

## 2021-07-02 NOTE — Op Note (Signed)
   Operative Note   Date: 07/02/2021  Procedure: laparoscopic appendectomy  Pre-op diagnosis: acute appendicitis Post-op diagnosis: grade 2 appendicitis  Indication and clinical history: The patient is a 25 y.o. year old female with perforated appendicitis and elevated beta hCG.     Surgeon: Diamantina Monks, MD  Anesthesiologist: Nance Pew, DO Anesthesia: General  Findings:  Specimen: appendix EBL: <5cc Drains/Implants: none  Disposition: PACU - hemodynamically stable.  Description of procedure: The patient was positioned supine on the operating room table. Time-out was performed verifying correct patient, procedure, signature of informed consent, and administration of pre-operative antibiotics. General anesthetic induction and intubation were uneventful. Foley catheter insertion was not performed as patient voided immediately prior to the procedure . The abdomen was prepped and draped in the usual sterile fashion. An infra-umbilical incision was made using an open technique using zero vicryl stay sutures on either side of the fascia and a 57mm Hassan port inserted. After establishing pneumoperitoneum, which the patient tolerated well, the abdominal cavity was inspected and no injury of any intra-abdominal structures was identified. Two additional five millimeter ports were placed under direct visualization and using local anesthetic in the suprapubic and left lower quadrant regions. The patient was repositioned to Trendelenburg with the left side down. Further inspection of the right lower quadrant revealed  an appendix densely adherent to the cecum  and grade 2 appendicitis. Adhesiolysis was performed for approximately 30 minutes during dissection, mobilization, and identification of the appendix. The appendix was dissected away from its mesoappendix and an endoscopic stapler used to divide the appendix at its base. The staple line was inspected and appeared to be intact. The appendix was  then mobilized off the cecum using a combination of blunt dissection and the Harmonic scalpel. The appendix was placed in an endoscopic specimen retrieval bag, removed via the umbilical port site, and sent to pathology as a permanent specimen. The right lower quadrant was again inspected and hemostasis confirmed. The suprapubic and left lower quadrant ports were removed under direct visualization and hemostasis confirmed. The umbilical port was removed last after desufflating the abdomen and the fascia re-approximated using the stay sutures. Additional local anesthetic was administered at the umbilical incision site. The skin of all port sites was closed with 4-0 monocryl. Sterile dressings were applied. All sponge and instrument counts were correct at the conclusion of the procedure. The patient was awakened from anesthesia, extubated uneventfully, and transported to the PACU in good condition. There were no complications.   Upon entering the abdomen (organ space), I encountered a phlegmon involving the appendix .  CASE DATA:  Type of patient?: DOW CASE (Surgical Hospitalist [facility])  Status of Case? URGENT Add On  Infection Present At Time Of Surgery (PATOS)?  INFECTION of the appendix   Diamantina Monks, MD General and Trauma Surgery [facility]

## 2021-07-02 NOTE — Anesthesia Postprocedure Evaluation (Signed)
Anesthesia Post Note  Patient: Administrator, sports  Procedure(s) Performed: LAPAROSCOPIC APPENDECTOMY POSSIBLE OPEN     Patient location during evaluation: PACU Anesthesia Type: General Level of consciousness: awake and alert Pain management: pain level controlled Vital Signs Assessment: post-procedure vital signs reviewed and stable Respiratory status: spontaneous breathing, nonlabored ventilation, respiratory function stable and patient connected to nasal cannula oxygen Cardiovascular status: blood pressure returned to baseline and stable Postop Assessment: no apparent nausea or vomiting Anesthetic complications: no   No notable events documented.  Last Vitals:  Vitals:   07/01/21 2310 07/01/21 2333  BP: (!) 138/91 (!) 144/73  Pulse: (!) 102 99  Resp: (!) 21 19  Temp: 36.6 C 36.8 C  SpO2:  98%    Last Pain:  Vitals:   07/02/21 0242  TempSrc:   PainSc: 8                  Royalty Domagala P Cai Anfinson

## 2021-07-02 NOTE — Plan of Care (Signed)
  Problem: Education: Goal: Knowledge of General Education information will improve Description: Including pain rating scale, medication(s)/side effects and non-pharmacologic comfort measures Outcome: Progressing   Problem: Activity: Goal: Risk for activity intolerance will decrease Outcome: Progressing   Problem: Nutrition: Goal: Adequate nutrition will be maintained Outcome: Progressing   

## 2021-07-02 NOTE — Plan of Care (Signed)

## 2021-07-04 ENCOUNTER — Telehealth: Payer: Self-pay | Admitting: Obstetrics and Gynecology

## 2021-07-04 LAB — SURGICAL PATHOLOGY

## 2021-07-04 NOTE — Telephone Encounter (Signed)
TC to patient's number; which is her husband's cell number. Her spouse, Allena Napoleon, was able to identify his wife's and his information using DOB and full name. Notified him that patient was supposed to return to [facility] on 07/03/21 for repeat HCG. Asked him if he could bring her today for the bloodwork, since she did not come yesterday for the bloodwork. Address of [facility] given and told to come to [facility], entrance C. Patient's verbalized an understanding of the plan of care and agrees.   Raelyn Mora, CNM

## 2021-07-06 LAB — CULTURE, BLOOD (ROUTINE X 2)
Culture: NO GROWTH
Culture: NO GROWTH
Special Requests: ADEQUATE

## 2021-07-18 ENCOUNTER — Encounter (HOSPITAL_COMMUNITY): Payer: Self-pay | Admitting: Surgery

## 2021-08-01 ENCOUNTER — Ambulatory Visit (INDEPENDENT_AMBULATORY_CARE_PROVIDER_SITE_OTHER): Payer: BLUE CROSS/BLUE SHIELD

## 2021-08-01 ENCOUNTER — Other Ambulatory Visit: Payer: Self-pay

## 2021-08-01 VITALS — Wt 171.0 lb

## 2021-08-01 DIAGNOSIS — Z3200 Encounter for pregnancy test, result unknown: Secondary | ICD-10-CM

## 2021-08-01 DIAGNOSIS — Z3201 Encounter for pregnancy test, result positive: Secondary | ICD-10-CM | POA: Diagnosis not present

## 2021-08-01 LAB — POCT PREGNANCY, URINE: Preg Test, Ur: POSITIVE — AB

## 2021-08-01 NOTE — Progress Notes (Signed)
Possible Pregnancy  Patient declines interpreter today, desires to complete visit in without interpreter. Patient had appendectomy 07/01/21; beta HCG elevated on day of surgery. Pt recently delivered on 04/17/21. Here today for UPT to confirm negative. UPT is faintly positive. Patient reports vaginal bleeding resolved 3 weeks ago. Denies any pain. Reviewed with Constant, MD who recommends beta HCG today for confirmation. Explained we will contact patient with results.  Marjo Bicker, RN 08/01/2021  9:15 AM

## 2021-08-02 LAB — BETA HCG QUANT (REF LAB): hCG Quant: 6 m[IU]/mL

## 2021-08-02 NOTE — Progress Notes (Signed)
Patient was assessed and managed by nursing staff during this encounter. I have reviewed the chart and agree with the documentation and plan. I have also made any necessary editorial changes.  Catalina Antigua, MD 08/02/2021 10:38 AM
